# Patient Record
Sex: Male | Born: 1950 | Race: White | Hispanic: No | Smoking: Former smoker
Health system: Southern US, Community
[De-identification: ages and names within clinical notes are randomized; demographics above are authoritative.]

## PROBLEM LIST (undated history)

## (undated) DIAGNOSIS — T7840XA Allergy, unspecified, initial encounter: Secondary | ICD-10-CM

## (undated) DIAGNOSIS — I719 Aortic aneurysm of unspecified site, without rupture: Secondary | ICD-10-CM

## (undated) DIAGNOSIS — R600 Localized edema: Secondary | ICD-10-CM

## (undated) DIAGNOSIS — E785 Hyperlipidemia, unspecified: Secondary | ICD-10-CM

## (undated) DIAGNOSIS — I2510 Atherosclerotic heart disease of native coronary artery without angina pectoris: Secondary | ICD-10-CM

## (undated) DIAGNOSIS — I10 Essential (primary) hypertension: Secondary | ICD-10-CM

## (undated) DIAGNOSIS — K219 Gastro-esophageal reflux disease without esophagitis: Secondary | ICD-10-CM

## (undated) DIAGNOSIS — R12 Heartburn: Secondary | ICD-10-CM

## (undated) DIAGNOSIS — H9319 Tinnitus, unspecified ear: Secondary | ICD-10-CM

## (undated) HISTORY — DX: Atherosclerotic heart disease of native coronary artery without angina pectoris: I25.10

## (undated) HISTORY — DX: Allergy, unspecified, initial encounter: T78.40XA

## (undated) HISTORY — DX: Tinnitus, unspecified ear: H93.19

## (undated) HISTORY — PX: CATARACT EXTRACTION: SUR2

## (undated) HISTORY — DX: Localized edema: R60.0

## (undated) HISTORY — DX: Hyperlipidemia, unspecified: E78.5

## (undated) HISTORY — PX: RHINOPLASTY: SUR1284

## (undated) HISTORY — DX: Heartburn: R12

## (undated) HISTORY — DX: Gastro-esophageal reflux disease without esophagitis: K21.9

---

## 1972-03-20 HISTORY — PX: HAND SURGERY: SHX662

## 2009-03-20 HISTORY — PX: COLONOSCOPY: SHX174

## 2009-12-07 ENCOUNTER — Telehealth (INDEPENDENT_AMBULATORY_CARE_PROVIDER_SITE_OTHER): Payer: Self-pay | Admitting: *Deleted

## 2009-12-07 ENCOUNTER — Ambulatory Visit: Payer: Self-pay | Admitting: Internal Medicine

## 2009-12-07 DIAGNOSIS — R030 Elevated blood-pressure reading, without diagnosis of hypertension: Secondary | ICD-10-CM | POA: Insufficient documentation

## 2009-12-09 ENCOUNTER — Ambulatory Visit: Payer: Self-pay | Admitting: Internal Medicine

## 2009-12-10 LAB — CONVERTED CEMR LAB
ALT: 12 units/L (ref 0–53)
AST: 17 units/L (ref 0–37)
Albumin: 3.8 g/dL (ref 3.5–5.2)
Alkaline Phosphatase: 75 units/L (ref 39–117)
Basophils Relative: 0.6 % (ref 0.0–3.0)
CO2: 26 meq/L (ref 19–32)
Chloride: 102 meq/L (ref 96–112)
Direct LDL: 164.8 mg/dL
Eosinophils Relative: 1.3 % (ref 0.0–5.0)
GFR calc non Af Amer: 92.75 mL/min (ref >60)
Glucose, Bld: 98 mg/dL (ref 70–99)
HCT: 39.6 % (ref 39.0–52.0)
HDL: 36.3 mg/dL — ABNORMAL LOW (ref >39.00)
Lymphs Abs: 1.7 10*3/uL (ref 0.7–4.0)
MCHC: 34.2 g/dL (ref 30.0–36.0)
MCV: 92.2 fL (ref 78.0–100.0)
Monocytes Absolute: 0.7 10*3/uL (ref 0.1–1.0)
Platelets: 272 10*3/uL (ref 150.0–400.0)
Potassium: 4.7 meq/L (ref 3.5–5.1)
Sodium: 138 meq/L (ref 135–145)
WBC: 6.3 10*3/uL (ref 4.5–10.5)

## 2010-01-04 ENCOUNTER — Telehealth: Payer: Self-pay | Admitting: Family Medicine

## 2010-01-04 ENCOUNTER — Ambulatory Visit: Payer: Self-pay | Admitting: Internal Medicine

## 2010-01-04 DIAGNOSIS — K2940 Chronic atrophic gastritis without bleeding: Secondary | ICD-10-CM

## 2010-01-04 DIAGNOSIS — E785 Hyperlipidemia, unspecified: Secondary | ICD-10-CM | POA: Insufficient documentation

## 2010-01-25 ENCOUNTER — Encounter: Payer: Self-pay | Admitting: Family Medicine

## 2010-02-07 ENCOUNTER — Ambulatory Visit: Payer: Self-pay | Admitting: Unknown Physician Specialty

## 2010-02-07 ENCOUNTER — Encounter: Payer: Self-pay | Admitting: Family Medicine

## 2010-02-07 LAB — HM COLONOSCOPY

## 2010-02-09 LAB — PATHOLOGY REPORT

## 2010-02-22 ENCOUNTER — Ambulatory Visit: Payer: Self-pay | Admitting: Internal Medicine

## 2010-03-28 ENCOUNTER — Ambulatory Visit
Admission: RE | Admit: 2010-03-28 | Discharge: 2010-03-28 | Payer: Self-pay | Source: Home / Self Care | Attending: Family Medicine | Admitting: Family Medicine

## 2010-03-28 DIAGNOSIS — K2100 Gastro-esophageal reflux disease with esophagitis, without bleeding: Secondary | ICD-10-CM | POA: Insufficient documentation

## 2010-04-19 NOTE — Progress Notes (Signed)
----   Converted from flag ---- ---- 12/07/2009 12:39 PM, Trevor Boyden  MD wrote: please add CBC when patient comes for blood.  thanks ------------------------------

## 2010-04-19 NOTE — Assessment & Plan Note (Signed)
Summary: NEW PT TO EST/ABD PAIN/CLE   Vital Signs:  Patient profile:   60 year old male Height:      66 inches Weight:      157 pounds BMI:     25.43 Temp:     98.7 degrees F oral Pulse rate:   76 / minute Pulse rhythm:   regular BP sitting:   150 / 80  (left arm) Cuff size:   regular  Vitals Entered By: Selena Batten Dance CMA Duncan Dull) (December 07, 2009 9:32 AM)  CC: New to establish/Abd pain   History of Present Illness: CC: new patient, stomach  1. stomach issues - h/o indigestion, last few months worse.  1 mo ago had bad episode - heartburn with pain, now having pressure pain lower left abdomen.  Pain and indigestion after any solid foods.  + irregular bowel movements.  No blood in stool noted.  No nausea/vomiting.  No pain with defecation.  No fevers/chills.  weight down about 9 lbs in last 2 months.  appetite still there but not eating 2/2 worrying about pain.  2. BP up - never had elevated in past.  never on meds.  no HA, vision changes, chest pain, tightness, urinary changes, LE swelling.    Last tetanus shot years ago. Due for flu shot. Never had colonoscopy Had prostate checked, told normal.  Preventive Screening-Counseling & Management  Caffeine-Diet-Exercise     Caffeine use/day: 3-4 cup/day (coffee, soda, tea)  Current Medications (verified): 1)  Multivitamins  Tabs (Multiple Vitamin) .... 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  The patient denies anorexia, fever, weight gain, vision loss, decreased hearing, hoarseness, chest pain, syncope, dyspnea on exertion, peripheral edema, prolonged cough, headaches, hemoptysis, melena, hematochezia, hematuria, incontinence, muscle weakness, suspicious skin lesions, transient blindness, difficulty walking, depression, testicular masses, and enlarged lymph nodes.         lost 8-9 lbs in last 2-3 wks 2/2 pain per HPI  Physical Exam  General:  Well-developed,well-nourished,in no acute distress; alert,appropriate and cooperative throughout examination Head:  Normocephalic and atraumatic without obvious abnormalities. No apparent alopecia or balding. Eyes:  No corneal or conjunctival inflammation noted. EOMI. Perrla.  Ears:  External ear exam shows no significant lesions or deformities.  Otoscopic examination reveals clear canals, tympanic membranes are intact bilaterally without bulging, retraction, inflammation or discharge. Hearing is grossly normal bilaterally. Nose:  External nasal examination shows no deformity or inflammation. Nasal mucosa are pink and moist without lesions or exudates. Mouth:  Oral mucosa and oropharynx without lesions or exudates.  Teeth in good repair. Neck:  No deformities, masses, or tenderness noted.  no bruits Lungs:  Normal respiratory effort, chest expands symmetrically. Lungs are clear to auscultation, no crackles or wheezes. Heart:  Normal rate and regular rhythm. S1 and S2 normal without gallop, murmur, click, rub or other extra sounds. Abdomen:  Bowel sounds positive, abdomen soft without masses, organomegaly or hernias noted.  + tenderness to palpation epigastrically  as well as medial RUQ.  negative rebound negative murphy.  No abd bruits auscultated. Rectal:  No external abnormalities noted. Normal sphincter tone. No rectal masses or tenderness.  guaiac negative Prostate:  Prostate gland firm and smooth, no enlargement,  nodularity, tenderness, mass, asymmetry or induration.  + slight irregularity (crater).  Msk:  No deformity or scoliosis noted of thoracic or lumbar spine.   Pulses:  2+ periph pulses Extremities:  no edema Neurologic:  CN grossly intact, gait and station intact, sensation and strength intact Skin:  Intact without suspicious lesions or rashes Psych:  full affect   Impression & Recommendations:  Problem # 1:  ABDOMINAL PAIN, LEFT UPPER QUADRANT (ICD-789.02) leading differential is GERD.  treat as such with discussion of GERD precautions and foods to avoid, omeprazole daily for 3 wks, then as needed.  RTC 3 wks, sooner for red flags.  if not improving witih current treatment, consider diverticulitis (although no fever or blood in stool) vs other pathology (rec colonsocopy)  Problem # 2:  HEALTH MAINTENANCE EXAM (ICD-V70.0) Reviewed preventive care protocols, scheduled due services, and updated immunizations.  tdap and flu today.  Problem # 3:  SPECIAL SCREENING MALIGNANT NEOPLASM OF PROSTATE (ICD-V76.44) PSA with blood work.  DRE with slight irregularity prostate, but no nodules, masses, nontender.  await PSA.  Problem # 4:  SPECIAL SCREENING FOR MALIGNANT NEOPLASMS COLON (ICD-V76.51)  hemoccult neg today.  likely recommend get colonoscopy.  Orders: Hemoccult Guaiac-1 spec.(in office) (82270)  Problem # 5:  ELEVATED BP READING WITHOUT DX HYPERTENSION (ICD-796.2) continue to monitor.  may need meds to help control, but first time meeting new doc.  BP today: 150/80  Complete Medication List: 1)  Multivitamins Tabs (Multiple vitamin) .... One a day 2)  Omeprazole 40 Mg Cpdr (Omeprazole) .... One daily for 3 weeks then as needed reflux  Other Orders: Flu Vaccine 74yrs + (09811) Admin 1st Vaccine (91478) Tdap => 45yrs IM (29562) Admin of Any Addtl Vaccine (13086)  Patient Instructions: 1)  Please return this week in AM fasting for blood work 2)  [FLP, CMP, PSA, TSH, lipase  789.02, V76.49, V70.0] 3)  Please return in 3-4 wks for follow up. 4)  tetanus and flu shot today. 5)  your stomach pain could be beginnings of reflux. 6)  Start omeprazole daily for 3wks then as needed reflux symptoms. 7)  Head of bed elevated. 8)  Avoidance of citrus, fatty foods, chocolate, peppermint, and excessive alcohol, along with sodas, orange juice (acidic drinks) 9)  At least a few hours between dinner and bed, minimize naps after eating. 10)  Please return sooner if contineu to lose weight despite medicine, or worsening of pain or fevers >101.5 or nausea/vomiting or other concerns. 11)  Pleasure to meet you today, call clinic with questions. Prescriptions: OMEPRAZOLE 40 MG CPDR (OMEPRAZOLE) one daily for 3 weeks then as needed reflux  #30 x 3   Entered and Authorized by:   Eustaquio Boyden  MD   Signed by:   Eustaquio Boyden  MD on 12/07/2009   Method used:   Electronically to        [facility]  [address] (retail)       [address]       [place], [place]  [address]       Ph: [phone]       Fax: [phone]   RxID:   [id number]   Prior Medications: Current Allergies (  reviewed today): No known allergies    Immunizations Administered:  Influenza Vaccine # 1:    Vaccine Type: Fluvax 3+    Site: right deltoid    Mfr: GlaxoSmithKline    Dose: 0.5 ml    Route: IM    Given by: Janee Morn CMA (AAMA)    Exp. Date: 09/17/2010    Lot #: [id number]    VIS given: 10/12/09 version given December 07, 2009.  Tetanus Vaccine:    Vaccine Type: Tdap    Site: left deltoid    Mfr: GlaxoSmithKline    Dose: 0.5 ml    Route: IM    Given by: Selena Batten Dance CMA (AAMA)    Exp. Date: 12/09/2011    Lot #: [id number]    VIS given: 02/05/08 version given December 07, 2009.  Flu Vaccine Consent Questions:    Do you have a history of severe allergic reactions to this vaccine? no    Any prior history of allergic reactions to egg and/or gelatin? no    Do you  have a sensitivity to the preservative Thimersol? no    Do you have a past history of Guillan-Barre Syndrome? no    Do you currently have an acute febrile illness? no    Have you ever had a severe reaction to latex? no    Vaccine information given and explained to patient? yes    Prevention & Chronic Care Immunizations   Influenza vaccine: Fluvax 3+  (12/07/2009)   Influenza vaccine due: 11/19/2010    Tetanus booster: 12/07/2009: Tdap   Tetanus booster due: 12/08/2019    Pneumococcal vaccine: Not documented  Colorectal Screening   Hemoccult: Not documented    Colonoscopy: Not documented  Other Screening   PSA: Not documented   Smoking status: Not documented  Lipids   Total Cholesterol: Not documented   LDL: Not documented   LDL Direct: Not documented   HDL: Not documented   Triglycerides: Not documented

## 2010-04-19 NOTE — Progress Notes (Signed)
Summary: fyi regarding GI referral...  Phone Note Call from Patient   Summary of Call: GI referral is not scheduled until Nov 22nd, however he was placed on a waiting list..Marland KitchenPt says his ins will pay at a higher rate if he goes to [facility]. fyi to Dr. Sharen Hones.Daine Gip  January 04, 2010 9:04 AM  Initial call taken by: Daine Gip,  January 04, 2010 9:04 AM  Follow-up for Phone Call        noted Follow-up by: Eustaquio Boyden  MD,  January 04, 2010 11:25 PM

## 2010-04-19 NOTE — Procedures (Signed)
Summary: Upper GI Endoscopy by Dr.[facility]  Upper GI Endoscopy by Dr.Robert [facility]   Imported By: Beau Fanny 02/15/2010 15:53:46  _____________________________________________________________________  External Attachment:    Type:   Image     Comment:   External Document

## 2010-04-19 NOTE — Assessment & Plan Note (Signed)
Summary: 3-4 week follow up/rbh   Vital Signs:  Patient profile:   60 year old male Weight:      156 pounds Temp:     98.4 degrees F oral Pulse rate:   80 / minute Pulse rhythm:   regular BP sitting:   130 / 78  (left arm) Cuff size:   regular  Vitals Entered By: Selena Batten Dance CMA Duncan Dull) (January 04, 2010 8:04 AM) CC: Follow up   History of Present Illness: CC: f/u abd pain  abd pain - has area on left upper abdomen where feels irritation/inflammation pain.  Having irregular bowel movements.  After eating solids feels bloating and pain come on.  Had lost 10 lbs because worried with eating 2/2 pain.  No problem with drinking liquids.  Prescribed omeprazole 40mg  last visit, took for 3 wks daily and pain improved, but never really went away, still had to space out meals and eat small quantities.  Now taking PPI intermittently.  + early satiety.  Pain described as bloating pressure irritation, worse after eating.  No stabbing, no radiation.  No fevers/chills, no blood in stool, voiding ok.  Laying off orance juice.    Requests to go to [facility].  Quit smoking 30 years ago.  no fmhx of colon cancer, stomach cancer.  has cut back on all EtOH given stomach pain.  Current Medications (verified): 1)  Multivitamins  Tabs (Multiple Vitamin) .... One A Day 2)  Omeprazole 40 Mg Cpdr (Omeprazole) .... One Daily For 3 Weeks Then As Needed Reflux  Allergies (verified): No Known Drug Allergies  Past History:  Past medical, surgical, family and social histories (including risk factors) reviewed for relevance to current acute and chronic problems.  Past Medical History: Reviewed history from 12/07/2009 and no changes required. none  Past Surgical History: Reviewed history from 12/07/2009 and no changes required. cataract surgery both eyes  right hand surgery 1974 nasal septal deviation surgery 1973  Family History: Reviewed history from 12/07/2009 and no changes required. F: D CVA (61yo) M:  D CAD/MI (61yo)  No DM, CA  Social History: Reviewed history from 12/07/2009 and no changes required. 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 Lungs are clear to auscultation, no crackles or wheezes. Heart:  Normal rate and regular rhythm. S1 and S2 normal without gallop, murmur, click, rub or other extra sounds. Abdomen:  Bowel sounds positive, abdomen soft without masses, organomegaly or hernias noted.  + tenderness to palpation epigastrically.  negative rebound, negative murphy.  No abd bruits auscultated. Pulses:  2+ periph pulses Extremities:  no edema   Impression & Recommendations:  Problem # 1:  ABDOMINAL PAIN, EPIGASTRIC (ICD-789.06) ?ulcer vs stricture.  referral to GI given continued problem despite PPI, weight loss, early satiety.  h/o GERD, due for colonoscopy as well.  No family h/o stomach or colon cancer.  blood work last visit WNL (provided copy to patient to bring to appt with GI).  f/u with me in 3-4 mo after sees GI, return sooner if any concerns.  Orders: Gastroenterology Referral (GI)  Problem # 2:  HYPERLIPIDEMIA (ICD-272.4) LDL too high, pt would like medication given fm hx.  start pravastatin, advised may wait until after sees GI to start cholesterol  meds.  His updated medication list for this problem includes:    Pravastatin Sodium 40 Mg Tabs (Pravastatin sodium) .Marland Kitchen... Take one daily for cholesterol at night  Labs Reviewed: SGOT: 17 (12/09/2009)   SGPT: 12 (12/09/2009)   HDL:36.30 (12/09/2009)  Chol:212 (12/09/2009)  Trig:124.0 (12/09/2009)  Problem # 3:  ELEVATED BP READING WITHOUT DX HYPERTENSION (ICD-796.2) bp good today.  continue to  monitor.  BP today: 130/78 Prior BP: 150/80 (12/07/2009)  Labs Reviewed: Creat: 0.9 (12/09/2009) Chol: 212 (12/09/2009)   HDL: 36.30 (12/09/2009)   TG: 124.0 (12/09/2009)  Instructed in low sodium diet (DASH Handout) and behavior modification.    Complete Medication List: 1)  Multivitamins Tabs (Multiple vitamin) .... One a day 2)  Omeprazole 40 Mg Cpdr (Omeprazole) .... One daily for 3 weeks then as needed reflux 3)  Pravastatin Sodium 40 Mg Tabs (Pravastatin sodium) .... Take one daily for cholesterol at night  Patient Instructions: 1)  Return in 3-4 mo for follow up. 2)  Start pravastatin for cholesterol, this may wait until after you see stomach doctor. 3)  Referral to stomach doctor for abdominal pain.  May be ulcer but I think it's prudent to evaluate your stomach better with endoscopy.  He may also do colonoscopy since you're due.  Stop by Marion's office to set up appointment.   4)  Good to see you today, call clinic with quesitons. Prescriptions: PRAVASTATIN SODIUM 40 MG TABS (PRAVASTATIN SODIUM) take one daily for cholesterol at night  #30 x 3   Entered and Authorized by:   Eustaquio Boyden  MD   Signed by:   Eustaquio Boyden  MD on 01/04/2010   Method used:   Electronically to        [facility]  [address] (retail)       [address]       [place], [place]  [address]       Ph: [phone]       Fax: [phone]   RxID:   [id number]    Orders Added: 1)  Gastroenterology Referral [GI] 2)  Est. Patient Level III [84132]    Current Allergies (reviewed today): No known allergies

## 2010-04-19 NOTE — Procedures (Signed)
Summary: Colonoscopy by Dr.Robert [facility]  Colonoscopy by Dr.Robert [facility]   Imported By: Beau Fanny 02/15/2010 15:53:14  _____________________________________________________________________  External Attachment:    Type:   Image     Comment:   External Document  Appended Document: Colonoscopy by Dr.Robert [facility]    Clinical Lists Changes  Observations: Added new observation of COLONNXTDUE: 02/2020 (02/15/2010 16:09) Added new observation of PAST SURG HX: cataract surgery both eyes  right hand surgery 1974 nasal septal deviation surgery 1973 colonoscopy - diverticulosis, int hemmorrhoids, rpt 10 years (01/2010) EGD - Barrett's esophagus, erosive gastropathy, awaiting bx path (01/2010) (02/15/2010 16:09) Added new observation of COLONOSCOPY:  Results: Hemorrhoids.     Results: Diverticulosis.        (02/07/2010 16:12)        Colonoscopy  Procedure date:  02/07/2010  Findings:       Results: Hemorrhoids.     Results: Diverticulosis.         Procedures Next Due Date:    Colonoscopy: 02/2020   Past History:  Past Surgical History: cataract surgery both eyes  right hand surgery 1974 nasal septal deviation surgery 1973 colonoscopy - diverticulosis, int hemmorrhoids, rpt 10 years (01/2010) EGD - Barrett's esophagus, erosive gastropathy, awaiting bx path (01/2010)

## 2010-04-19 NOTE — Consult Note (Signed)
Summary: Anola Gurney Dr. Markham Jordan - set up with UGI,colon, stool O&P  [facility]   Imported By: Maryln Gottron 02/14/2010 11:26:37  _____________________________________________________________________  External Attachment:    Type:   Image     Comment:   External Document

## 2010-04-19 NOTE — Assessment & Plan Note (Signed)
Summary: SORE THROAT/ 12:00   Vital Signs:  Patient profile:   60 year old male Weight:      155.25 pounds Temp:     99.1 degrees F oral Pulse rate:   88 / minute Pulse rhythm:   regular BP sitting:   138 / 90  (left arm) Cuff size:   regular  Vitals Entered By: Selena Batten Dance CMA Duncan Dull) (February 22, 2010 12:04 PM) CC: Sore Throat/body aches   History of Present Illness: CC: ST, body aches  4 d h/o ST, congestion, RN and nasal discharge mostly clear some yellow/thick, mild subjective fever.  + sinus tender bilateral.  Taking tylenol and sinus decongestant.    Not more hoarse than usual.  No abd pain, n/v/d, rashes, myalgias, arthralgias.  h/o sinus infections in past.  No smoking for long time.   omeprazole helping stomach.  s/p EGD/colonoscopy  Current Medications (verified): 1)  Multivitamins  Tabs (Multiple Vitamin) .... One A Day 2)  Omeprazole 40 Mg Cpdr (Omeprazole) .... One Daily For 3 Weeks Then As Needed Reflux 3)  Pravastatin Sodium 40 Mg Tabs (Pravastatin Sodium) .... 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 sinus maxillary pressure, nontender Eyes:  No corneal or conjunctival  inflammation noted. EOMI. Perrla.  Ears:  TMs clear bilaterally Nose:  nares congested Mouth:  Oral mucosa and oropharynx without lesions or exudates.  Teeth in good repair. Neck:  No deformities, masses, or tenderness noted.  no bruits Lungs:  Normal respiratory effort, chest expands symmetrically. Lungs are clear to auscultation, no crackles or wheezes. Heart:  Normal rate and regular rhythm. S1 and S2 normal without gallop, murmur, click, rub or other extra sounds. Pulses:  2+ radpulses Extremities:  no pedal edema Skin:  Intact without suspicious lesions or rashes   Impression & Recommendations:  Problem # 1:  SINUSITIS, ACUTE (ICD-461.9)  early likely viral with pharyngitis.  Instructed on treatment. Call if symptoms persist or worsen.  call at end of week if not better, consider abx/nasal steroid  Complete Medication List: 1)  Multivitamins Tabs (Multiple vitamin) .... One a day 2)  Omeprazole 40 Mg Cpdr (Omeprazole) .... One daily for 3 weeks then as needed reflux 3)  Pravastatin Sodium 40 Mg Tabs (Pravastatin sodium) .... Take one daily for cholesterol at night  Patient Instructions: 1)  You have viral respiratory infection. 2)  Take guaifenesin 400mg  IR 1 1/2 pills in am and at noon with plenty of fluid to help mobilize mucous. 3)  Use nasal saline spray or neti pot to help drainage of sinuses. 4)  Continue ibuprofen/tylenol for inflammation in throat. 5)  Push fluids and plenty of rest over next few days. 6)  If not better with above measures, call me with update. 7)  If you start having fevers >101.5, trouble swallowing or  breathing, or are worsening instead of improving as expected, you may need to be seen again. 8)  Good to see you today, call clinic with questions.    Orders Added: 1)  Est. Patient Level III [03474]    Current Allergies (reviewed today): No known allergies   Laboratory Results  Date/Time Received: February 22, 2010 12:06 PM  Date/Time  Reported: February 22, 2010 12:06 PM   Other Tests  Rapid Strep: negative

## 2010-04-21 NOTE — Assessment & Plan Note (Signed)
Summary: 3-4 MONTH FOLLOW UP/[facility]   Vital Signs:  Patient profile:   60 year old male Weight:      158.25 pounds Temp:     98.8 degrees F oral Pulse rate:   76 / minute Pulse rhythm:   regular BP sitting:   140 / 80  (left arm) Cuff size:   regular  Vitals Entered By: Selena Batten Dance CMA Duncan Dull) (March 28, 2010 8:19 AM) CC: 3 month Follow up   History of Present Illness: CC: 11mo f/u  GERD with gastritis and esophagitis - omeprazole 40mg  daily, still reflux sxs about once a week, regardless of what he eats.  tolerating omeprazole daily well.  HLD - pravastatin 40mg  daily.  fmhx CAD/MI.  LDL goal would be <100.  no myalgias.    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 Lungs are clear to auscultation, no crackles or wheezes. Heart:  Normal rate and regular rhythm. S1 and S2 normal without gallop, murmur, click, rub or other extra sounds. Abdomen:  Bowel sounds positive,abdomen soft and non-tender without masses, organomegaly or hernias noted.   Impression &  Recommendations:  Problem # 1:  GASTRITIS, CHRONIC (ICD-535.10)  His updated medication list for this problem includes:    Omeprazole 40 Mg Cpdr (Omeprazole) ..... One daily for 3 weeks then as needed reflux    Nexium 40 Mg Cpdr (Esomeprazole magnesium) .Marland Kitchen... Take one daily for reflux  Discussed use of medication, as well as lifestyle changes.   discussed longterm PPI use and need for good calcium intake, pt states does get good amt dairy products.  Problem # 2:  REFLUX ESOPHAGITIS (ICD-530.11) see above.  not fully controlled on once daily omeprazole.  increase to nexium, have advised pt call insurance ot see if covered, and contact us with alternative if not.  Problem # 3:  HYPERLIPIDEMIA (ICD-272.4) continue.  recheck in 2 mo for 75mo f/u.  goal <100 given fmhx.  if not optimal, likely change to atorvastatin (higher potency) His updated medication list for this problem includes:    Pravastatin Sodium 40 Mg Tabs (Pravastatin sodium) .Marland Kitchen... Take one daily for cholesterol at night  Labs Reviewed: SGOT: 17 (12/09/2009)   SGPT: 12 (12/09/2009)   HDL:36.30 (12/09/2009)  Chol:212 (12/09/2009)  Trig:124.0 (12/09/2009)  Complete Medication List: 1)  Multivitamins Tabs (Multiple vitamin) .... One a day 2)  Omeprazole 40 Mg Cpdr (Omeprazole) .... One daily for 3 weeks then as needed reflux 3)  Pravastatin Sodium 40 Mg Tabs (Pravastatin sodium) .... Take one daily for cholesterol at night 4)  Nexium 40 Mg Cpdr (Esomeprazole magnesium) .... Take one daily for reflux  Patient Instructions: 1)  Check with insurance to see what they prefer as second line  treatment for gastritis/esophagitis as omeprazole isn't fully controlling symptoms. 2)  I have refilled omeprazole x 1 month, and have given you a prescription for nexium.  Call us with update on insurance preference. 3)  Please return in April for blood work [CMP, FLP 272.4] 4)  Please return in 4-6 months for follow up. Prescriptions: NEXIUM 40  MG CPDR (ESOMEPRAZOLE MAGNESIUM) take one daily for reflux  #31 x 1   Entered and Authorized by:   Eustaquio Boyden  MD   Signed by:   Eustaquio Boyden  MD on 03/28/2010   Method used:   Print then Give to Patient   RxID:   [id number] OMEPRAZOLE 40 MG CPDR (OMEPRAZOLE) one daily for 3 weeks then as needed reflux  #30 x 2   Entered and Authorized by:   Eustaquio Boyden  MD   Signed by:   Eustaquio Boyden  MD on 03/28/2010   Method used:   Electronically to        [facility]  [address] (retail)       [address]       [place], [place]  [address]       Ph: [phone]       Fax: [phone]   RxID:   [id number]    Orders Added: 1)  Est. Patient Level III [74259]    Current Allergies (reviewed today): No known allergies   Prevention & Chronic Care Immunizations   Influenza vaccine: Fluvax 3+  (12/07/2009)   Influenza vaccine due: 11/19/2010    Tetanus booster: 12/07/2009: Tdap   Tetanus booster due: 12/08/2019    Pneumococcal vaccine: Not documented    H. zoster vaccine: Not documented  Colorectal Screening   Hemoccult: Not documented   Hemoccult action/deferral: Not indicated  (02/25/2010)    Colonoscopy:  Results: Hemorrhoids.     Results: Diverticulosis.         (02/07/2010)   Colonoscopy due: 02/2020  Other Screening   PSA: 1.93  (12/09/2009)   Smoking status: Not documented  Lipids   Total Cholesterol: 212  (12/09/2009)   LDL: Not documented   LDL Direct: 164.8  (12/09/2009)   HDL: 36.30  (12/09/2009)   Triglycerides: 124.0  (12/09/2009)    SGOT (AST): 17  (12/09/2009)   SGPT (ALT): 12  (12/09/2009)   Alkaline phosphatase: 75  (12/09/2009)   Total bilirubin: 0.7  (12/09/2009)    Lipid flowsheet reviewed?: Yes   Progress toward LDL goal: Unchanged  Self-Management Support :   Personal Goals (by the next clinic visit) :      Personal LDL goal: 130  (03/28/2010)    Lipid self-management support: Not  documented

## 2010-06-16 ENCOUNTER — Other Ambulatory Visit: Payer: Self-pay | Admitting: Family Medicine

## 2010-06-16 DIAGNOSIS — E785 Hyperlipidemia, unspecified: Secondary | ICD-10-CM

## 2010-06-16 NOTE — Telephone Encounter (Signed)
Ok to fill.  Sent in 1 month supply.

## 2010-06-23 ENCOUNTER — Other Ambulatory Visit: Payer: Self-pay | Admitting: Family Medicine

## 2010-06-23 DIAGNOSIS — E785 Hyperlipidemia, unspecified: Secondary | ICD-10-CM

## 2010-06-28 ENCOUNTER — Other Ambulatory Visit (INDEPENDENT_AMBULATORY_CARE_PROVIDER_SITE_OTHER): Payer: PRIVATE HEALTH INSURANCE | Admitting: Family Medicine

## 2010-06-28 DIAGNOSIS — E785 Hyperlipidemia, unspecified: Secondary | ICD-10-CM

## 2010-06-28 LAB — COMPREHENSIVE METABOLIC PANEL
BUN: 13 mg/dL (ref 6–23)
CO2: 28 mEq/L (ref 19–32)
Calcium: 9.4 mg/dL (ref 8.4–10.5)
Chloride: 105 mEq/L (ref 96–112)
Creatinine, Ser: 0.9 mg/dL (ref 0.4–1.5)
GFR: 90.23 mL/min (ref >60.00)

## 2010-06-28 LAB — LIPID PANEL
Cholesterol: 160 mg/dL (ref 0–200)
HDL: 38.9 mg/dL — ABNORMAL LOW (ref >39.00)
Triglycerides: 183 mg/dL — ABNORMAL HIGH (ref 0.0–149.0)

## 2010-09-27 ENCOUNTER — Ambulatory Visit: Payer: Self-pay | Admitting: Family Medicine

## 2010-10-06 ENCOUNTER — Ambulatory Visit: Payer: Self-pay | Admitting: Family Medicine

## 2012-07-02 LAB — LIPID PANEL
CHOLESTEROL: 260 mg/dL — AB (ref 0–200)
HDL: 55 mg/dL (ref 35–70)
LDL Cholesterol: 169 mg/dL
TRIGLYCERIDES: 180 mg/dL — AB (ref 40–160)

## 2012-07-02 LAB — PSA: PSA: 3.1

## 2012-07-02 LAB — BASIC METABOLIC PANEL
BUN: 17 mg/dL (ref 4–21)
Creatinine: 1.3 mg/dL (ref 0.6–1.3)

## 2013-05-25 ENCOUNTER — Ambulatory Visit: Payer: Self-pay | Admitting: Physician Assistant

## 2013-05-25 LAB — RAPID STREP-A WITH REFLX: Micro Text Report: NEGATIVE

## 2013-05-27 LAB — BETA STREP CULTURE(ARMC)

## 2014-07-28 DIAGNOSIS — I714 Abdominal aortic aneurysm, without rupture, unspecified: Secondary | ICD-10-CM | POA: Insufficient documentation

## 2014-07-28 DIAGNOSIS — E782 Mixed hyperlipidemia: Secondary | ICD-10-CM | POA: Insufficient documentation

## 2014-07-28 DIAGNOSIS — I10 Essential (primary) hypertension: Secondary | ICD-10-CM | POA: Insufficient documentation

## 2014-08-13 ENCOUNTER — Other Ambulatory Visit: Payer: Self-pay | Admitting: Vascular Surgery

## 2014-08-13 DIAGNOSIS — I714 Abdominal aortic aneurysm, without rupture, unspecified: Secondary | ICD-10-CM

## 2014-08-13 DIAGNOSIS — I712 Thoracic aortic aneurysm, without rupture, unspecified: Secondary | ICD-10-CM

## 2014-08-21 ENCOUNTER — Ambulatory Visit
Admission: RE | Admit: 2014-08-21 | Discharge: 2014-08-21 | Disposition: A | Discharge status: Home / Self Care | Payer: No Typology Code available for payment source | Source: Ambulatory Visit | Attending: Vascular Surgery | Admitting: Vascular Surgery

## 2014-08-21 ENCOUNTER — Ambulatory Visit: Admission: RE | Admit: 2014-08-21 | Payer: No Typology Code available for payment source | Source: Ambulatory Visit

## 2014-08-21 DIAGNOSIS — I714 Abdominal aortic aneurysm, without rupture, unspecified: Secondary | ICD-10-CM

## 2014-08-21 DIAGNOSIS — N400 Enlarged prostate without lower urinary tract symptoms: Secondary | ICD-10-CM | POA: Diagnosis not present

## 2014-08-21 DIAGNOSIS — I712 Thoracic aortic aneurysm, without rupture, unspecified: Secondary | ICD-10-CM

## 2014-08-21 DIAGNOSIS — K8020 Calculus of gallbladder without cholecystitis without obstruction: Secondary | ICD-10-CM | POA: Insufficient documentation

## 2014-08-21 DIAGNOSIS — R918 Other nonspecific abnormal finding of lung field: Secondary | ICD-10-CM | POA: Diagnosis not present

## 2014-08-21 DIAGNOSIS — R911 Solitary pulmonary nodule: Secondary | ICD-10-CM | POA: Diagnosis not present

## 2014-08-21 DIAGNOSIS — I2510 Atherosclerotic heart disease of native coronary artery without angina pectoris: Secondary | ICD-10-CM | POA: Insufficient documentation

## 2014-08-21 HISTORY — DX: Aortic aneurysm of unspecified site, without rupture: I71.9

## 2014-08-21 HISTORY — DX: Essential (primary) hypertension: I10

## 2014-08-21 MED ORDER — IOHEXOL 350 MG/ML SOLN
100.0000 mL | Freq: Once | INTRAVENOUS | Status: AC | PRN
  Administered 2014-08-21: 150 mL via INTRAVENOUS

## 2015-02-02 DIAGNOSIS — R600 Localized edema: Secondary | ICD-10-CM | POA: Insufficient documentation

## 2015-03-03 DIAGNOSIS — I10 Essential (primary) hypertension: Secondary | ICD-10-CM | POA: Diagnosis not present

## 2015-03-29 ENCOUNTER — Encounter: Payer: Self-pay | Admitting: Internal Medicine

## 2015-03-29 ENCOUNTER — Other Ambulatory Visit: Payer: Self-pay | Admitting: Internal Medicine

## 2015-03-29 DIAGNOSIS — K2100 Gastro-esophageal reflux disease with esophagitis, without bleeding: Secondary | ICD-10-CM

## 2015-03-29 DIAGNOSIS — K2940 Chronic atrophic gastritis without bleeding: Secondary | ICD-10-CM

## 2015-04-08 ENCOUNTER — Encounter: Payer: Self-pay | Admitting: Internal Medicine

## 2015-05-17 ENCOUNTER — Ambulatory Visit (INDEPENDENT_AMBULATORY_CARE_PROVIDER_SITE_OTHER): Payer: Commercial Managed Care - HMO | Admitting: Internal Medicine

## 2015-05-17 ENCOUNTER — Encounter: Payer: Self-pay | Admitting: Internal Medicine

## 2015-05-17 VITALS — BP 176/98 | HR 84 | Ht 65.5 in | Wt 153.6 lb

## 2015-05-17 DIAGNOSIS — I714 Abdominal aortic aneurysm, without rupture, unspecified: Secondary | ICD-10-CM

## 2015-05-17 DIAGNOSIS — R911 Solitary pulmonary nodule: Secondary | ICD-10-CM | POA: Insufficient documentation

## 2015-05-17 DIAGNOSIS — Z0000 Encounter for general adult medical examination without abnormal findings: Secondary | ICD-10-CM

## 2015-05-17 DIAGNOSIS — Z125 Encounter for screening for malignant neoplasm of prostate: Secondary | ICD-10-CM | POA: Diagnosis not present

## 2015-05-17 DIAGNOSIS — Z23 Encounter for immunization: Secondary | ICD-10-CM | POA: Diagnosis not present

## 2015-05-17 DIAGNOSIS — I10 Essential (primary) hypertension: Secondary | ICD-10-CM

## 2015-05-17 DIAGNOSIS — E785 Hyperlipidemia, unspecified: Secondary | ICD-10-CM | POA: Diagnosis not present

## 2015-05-17 MED ORDER — HYDROCHLOROTHIAZIDE 25 MG PO TABS: 25.0000 mg | ORAL_TABLET | Freq: Every day | ORAL | Status: DC

## 2015-05-17 NOTE — Patient Instructions (Addendum)
Health Maintenance  Topic Date Due  . Hepatitis C Screening  11/14/2015 (Originally May 22, 1950)  . ZOSTAVAX  05/16/2020 (Originally 03/04/2010)  . HIV Screening  05/16/2020 (Originally 03/04/1966)  . INFLUENZA VACCINE  10/19/2015  . PNA vac Low Risk Adult (2 of 2 - PPSV23) 05/16/2016  . COLONOSCOPY  08/19/2019  . TETANUS/TDAP  12/08/2019      Pneumococcal Conjugate Vaccine (PCV13)  1. Why get vaccinated? Vaccination can protect both children and adults from pneumococcal disease. Pneumococcal disease is caused by bacteria that can spread from person to person through close contact. It can cause ear infections, and it can also lead to more serious infections of the:  Lungs (pneumonia),  Blood (bacteremia), and  Covering of the brain and spinal cord (meningitis). Pneumococcal pneumonia is most common among adults. Pneumococcal meningitis can cause deafness and brain damage, and it kills about 1 child in 10 who get it. Anyone can get pneumococcal disease, but children under 23 years of age and adults 51 years and older, people with certain medical conditions, and cigarette smokers are at the highest risk. Before there was a vaccine, the [place] saw:  more than 700 cases of meningitis,  about 13,000 blood infections,  about 5 million ear infections, and  about 200 deaths in children under 5 each year from pneumococcal disease. Since vaccine became available, severe pneumococcal disease in these children has fallen by 88%. About 18,000 older adults die of pneumococcal disease each year in the [place]. Treatment of pneumococcal infections with penicillin and other drugs is not as effective as it used to be, because some strains of the disease have become resistant to these drugs. This makes prevention of the disease, through vaccination, even more important. 2. PCV13 vaccine Pneumococcal conjugate vaccine (called PCV13) protects against 13 types of pneumococcal  bacteria. PCV13 is routinely given to children at 2, 4, 6, and 21-64 months of age. It is also recommended for children and adults 24 to 6 years of age with certain health conditions, and for all adults 49 years of age and older. Your doctor can give you details. 3. Some people should not get this vaccine Anyone who has ever had a life-threatening allergic reaction to a dose of this vaccine, to an earlier pneumococcal vaccine called PCV7, or to any vaccine containing diphtheria toxoid (for example, DTaP), should not get PCV13. Anyone with a severe allergy to any component of PCV13 should not get the vaccine. Tell your doctor if the person being vaccinated has any severe allergies. If the person scheduled for vaccination is not feeling well, your healthcare provider might decide to reschedule the shot on another day. 4. Risks of a vaccine reaction With any medicine, including vaccines, there is a chance of reactions. These are usually mild and go away on their own, but serious reactions are also possible. Problems reported following PCV13 varied by age and dose in the series. The most common problems reported among children were:  About half became drowsy after the shot, had a temporary loss of appetite, or had redness or tenderness where the shot was given.  About 1 out of 3 had swelling where the shot was given.  About 1 out of 3 had a mild fever, and about 1 in 20 had a fever over 102.37F.  Up to about 8 out of 10 became fussy or irritable. Adults have reported pain, redness, and swelling where the shot was given; also mild fever, fatigue, headache, chills, or muscle pain. Young  children who get PCV13 along with inactivated flu vaccine at the same time may be at increased risk for seizures caused by fever. Ask your doctor for more information. Problems that could happen after any vaccine:  People sometimes faint after a medical procedure, including vaccination. Sitting or lying down for about  15 minutes can help prevent fainting, and injuries caused by a fall. Tell your doctor if you feel dizzy, or have vision changes or ringing in the ears.  Some older children and adults get severe pain in the shoulder and have difficulty moving the arm where a shot was given. This happens very rarely.  Any medication can cause a severe allergic reaction. Such reactions from a vaccine are very rare, estimated at about 1 in a million doses, and would happen within a few minutes to a few hours after the vaccination. As with any medicine, there is a very small chance of a vaccine causing a serious injury or death. The safety of vaccines is always being monitored. For more information, visit: http://floyd.org/ 5. What if there is a serious reaction? What should I look for?  Look for anything that concerns you, such as signs of a severe allergic reaction, very high fever, or unusual behavior. Signs of a severe allergic reaction can include hives, swelling of the face and throat, difficulty breathing, a fast heartbeat, dizziness, and weakness-usually within a few minutes to a few hours after the vaccination. What should I do?  If you think it is a severe allergic reaction or other emergency that can't wait, call 9-1-1 or get the person to the nearest hospital. Otherwise, call your doctor. Reactions should be reported to the Vaccine Adverse Event Reporting System (VAERS). Your doctor should file this report, or you can do it yourself through the VAERS web site at www.vaers.LAgents.no, or by calling 1-859-629-1284. VAERS does not give medical advice. 6. The National Vaccine Injury Compensation Program The [employer] Vaccine Injury Compensation Program (VICP) is a federal program that was created to compensate people who may have been injured by certain vaccines. Persons who believe they may have been injured by a vaccine can learn about the program and about filing a claim by calling 1-574-232-9637 or  visiting the VICP website at SpiritualWord.at. There is a time limit to file a claim for compensation. 7. How can I learn more?  Ask your healthcare provider. He or she can give you the vaccine package insert or suggest other sources of information.  Call your local or state health department.  Contact the Centers for Disease Control and Prevention (CDC):  Call (726) 355-8951 (1-800-CDC-INFO) or  Visit CDC's website at PicCapture.uy Vaccine Information Statement PCV13 Vaccine (01/22/2014)   This information is not intended to replace advice given to you by your health care provider. Make sure you discuss any questions you have with your health care provider.   Document Released: 01/01/2006 Document Revised: 03/27/2014 Document Reviewed: 01/29/2014 Elsevier Interactive Patient Education [employer]

## 2015-05-17 NOTE — Progress Notes (Signed)
Patient: Trevor Werner, Male    DOB: April 19, 1950, 65 y.o.   MRN: [id number] Visit Date: 05/17/2015  Today's Provider: Bari Edward, MD   Chief Complaint  Patient presents with  . Annual Exam  . Hypertension  . Hyperlipidemia   Subjective:   Initial preventative physical exam Trevor Werner is a 65 y.o. male who presents today for his Initial Preventative Physical Exam. He feels fairly well. He reports exercising walking regularly and doing yard work. He reports he is sleeping well.  Hypertension This is a chronic problem. The current episode started more than 1 year ago. The problem has been waxing and waning since onset. The problem is uncontrolled (average 140/80). Associated symptoms include chest pain. Pertinent negatives include no headaches, palpitations or shortness of breath. Risk factors for coronary artery disease include dyslipidemia.  Hyperlipidemia This is a chronic problem. The problem is controlled. Recent lipid tests were reviewed and are normal. Associated symptoms include chest pain. Pertinent negatives include no myalgias or shortness of breath. Current antihyperlipidemic treatment includes statins. The current treatment provides significant improvement of lipids.  AAA - patient is being followed by Vascular surgery.  He is a former smoker.  He denies abdominal pain or back pain. CT Angio dated 08/2014: IMPRESSION: VASCULAR  1. 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 The maximal aortic diameter is 5.6 cm (best measured on coronal reformatted images) in the mid descending thoracic aorta. The aortic root, ascending segments, arch and abdominal aorta remain within normal limits although the abdominal aorta is mildly ectatic. 2. A linear filling defect extending from eccentric thrombus at the aortic wall into the aortic lumen is concerning for a region of irregular and pedunculated thrombus. This could pose a risk for distal embolization. 3. Ectatic  bilateral common iliac arteries. 4. Replaced right hepatic artery and multiple bilateral renal arteries noted incidentally. NON VASCULAR  1. Nonspecific 6 mm ground-glass attenuation nodular opacity in the right upper lung. Differential considerations include a focus of infection/inflammation and potentially a low grade adenocarcinoma or adenocarcinoma in situ. Initial follow-up by chest CT without contrast is recommended in 3 months to confirm persistence. This recommendation follows the consensus statement: Recommendations for the Management of Subsolid Pulmonary Nodules Detected at CT: A Statement from the Fleischner Society as published in Radiology 2013; 266:304-317. 2. A subpleural 6 mm pulmonary nodule in the periphery of the lingula is highly likely a subpleural lymph node. Recommend attention on routine followup imaging for thoracic aorta surveillance. 3. Multivessel coronary artery calcifications. Please note that although the presence of coronary artery calcium documents the presence of coronary artery disease, the severity of this disease and any potential stenosis cannot be assessed on this non-gated CT examination. Assessment for potential risk factor modification, dietary therapy or pharmacologic therapy may be warranted, if clinically indicated. 4. Cholelithiasis. 5. Prostatomegaly.  These results will be called to the ordering clinician or representative by the Radiologist Assistant, and communication documented in the PACS or zVision Dashboard.  Signed,  Sterling Big, MD  [facility]  Review of Systems  Constitutional: Negative for fever, chills, diaphoresis and fatigue.  HENT: Negative for hearing loss, tinnitus, trouble swallowing and voice change.   Eyes: Negative for visual disturbance.  Respiratory: Negative for cough, chest tightness, shortness of breath and wheezing.   Cardiovascular: Positive for  chest pain. Negative for palpitations and leg swelling.  Gastrointestinal: Negative for diarrhea, constipation and blood in stool.  Endocrine: Negative for polydipsia and polyuria.  Genitourinary: Negative for dysuria, urgency, hematuria and decreased urine volume.  Musculoskeletal: Positive for back pain and arthralgias. Negative for myalgias and gait problem.  Skin: Negative for color change and rash.  Neurological: Negative for dizziness, light-headedness, numbness and headaches.  Psychiatric/Behavioral: Negative for sleep disturbance and dysphoric mood.    Social History   Social History  . Marital Status: Single    Spouse Name: N/A  . Number of Children: N/A  . Years of Education: N/A   Occupational History  . Not on file.   Social History Main Topics  . Smoking status: Former Smoker    Quit date: 03/20/1978  . Smokeless tobacco: Not on file  . Alcohol Use: 12.6 oz/week    21 Standard drinks or equivalent per week  . Drug Use: No  . Sexual Activity: Not on file   Other Topics Concern  . Not on file   Social History Narrative    Patient Active Problem List   Diagnosis Date Noted  . Pulmonary nodule, right 05/17/2015  . Edema leg 02/02/2015  . AAA (abdominal aortic aneurysm) without rupture (HCC) 07/28/2014  . Benign essential HTN 07/28/2014  . Reflux esophagitis 03/28/2010  . Hyperlipidemia 01/04/2010  . 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 Cataract extraction Bilateral   . Rhinoplasty    . Colonoscopy  2011    His family history includes Heart failure in his brother and mother; Stroke in his father.    Previous Medications   AMLODIPINE (NORVASC) 5 MG TABLET    Take 1 tablet by mouth daily.   ASPIRIN EC 81 MG TABLET    Take 1 tablet by mouth daily.   ATORVASTATIN (LIPITOR) 10 MG TABLET    Take 1 tablet by mouth at bedtime.   CARVEDILOL (COREG) 3.125 MG TABLET    Take 1 tablet by mouth 2 (two) times daily.     VALSARTAN (DIOVAN) 320 MG TABLET    Take 1 tablet by mouth daily.    Patient Care Team: Lamar Blinks, MD as Consulting Physician (Cardiology) Renford Dills, MD (Vascular Surgery)     Objective:   Vitals: BP 176/98 mmHg  Pulse 84  Ht 5' 5.5" (1.664 m)  Wt 153 lb 9.6 oz (69.673 kg)  BMI 25.16 kg/m2  Physical Exam  Constitutional: He is oriented to person, place, and time. He appears well-developed. No distress.  HENT:  Head: Normocephalic and atraumatic.  Eyes: Pupils are equal, round, and reactive to light.  Neck: Normal range of motion. Neck supple. Carotid bruit is not present.  Cardiovascular: Normal rate, regular rhythm and normal heart sounds.   Pulmonary/Chest: Effort normal and breath sounds normal. No respiratory distress. Right breast exhibits no mass. Left breast exhibits no mass.  Abdominal: Soft. Normal appearance and bowel sounds are normal. There is no hepatosplenomegaly. There is no tenderness. There is no CVA tenderness.  Musculoskeletal: Normal range of motion.  Lymphadenopathy:    He has no cervical adenopathy.  Neurological: He is alert and oriented to person, place, and time.  Skin: Skin is warm and dry. No rash noted.  Psychiatric: He has a normal mood and affect. His speech is normal and behavior is normal. Thought content normal.  Nursing note and vitals reviewed.     Visual Acuity Screening   Right eye Left eye Both eyes  Without correction: 20/30 20/30 20/25   With correction:       Activities of Daily Living  In your present state of health, do you have any difficulty performing the following activities: 05/17/2015  Hearing? N  Vision? N  Difficulty concentrating or making decisions? N  Walking or climbing stairs? N  Dressing or bathing? N  Doing errands, shopping? N    Fall Risk Assessment Fall Risk  05/17/2015  Falls in the past year? No      Depression Screen PHQ 2/9 Scores 05/17/2015  PHQ - 2 Score 0    Cognitive Testing  - 6-CIT   Correct? Score   What year is it? yes 0 Yes = 0    No = 4  What month is it? yes 0 Yes = 0    No = 3  Remember:     Floyde Parkins, [address], [place]     What time is it? yes 0 Yes = 0    No = 3  Count backwards from 20 to 1 yes 0 Correct = 0    1 error = 2   More than 1 error = 4  Say the months of the year in reverse. yes 0 Correct = 0    1 error = 2   More than 1 error = 4  What address did I ask you to remember? yes 0 Correct = 0  1 error = 2    2 error = 4    3 error = 6    4 error = 8    All wrong = 10       TOTAL SCORE  0/28   Interpretation:  Normal  Normal (0-7) Abnormal (8-28)      Medicare Initial Preventative Physical Exam  Reviewed patient's Family Medical History Reviewed and updated list of patient's medical providers Assessment of cognitive impairment was done Assessed patient's functional ability Established a written schedule for health screening services Health Risk Assessent Completed and Reviewed  Exercise Activities and Dietary recommendations Goals    None      Immunization History  Administered Date(s) Administered  . Influenza Whole 12/07/2009  . Influenza-Unspecified 03/18/2015  . Pneumococcal Conjugate-13 05/17/2015  . Td 12/07/2009    Health Maintenance  Topic Date Due  . Hepatitis C Screening  11/14/2015 (Originally 1950/09/17)  . ZOSTAVAX  05/16/2020 (Originally 03/04/2010)  . HIV Screening  05/16/2020 (Originally 03/04/1965)  . INFLUENZA VACCINE  10/19/2015  . PNA vac Low Risk Adult (2 of 2 - PPSV23) 05/16/2016  . COLONOSCOPY  08/19/2019  . TETANUS/TDAP  12/08/2019      Discussed health benefits of physical activity, and encouraged him to engage in regular exercise appropriate for his age and condition.    ------------------------------------------------------------------------------------------------------------   Assessment & Plan:  1. Medicare annual wellness visit, initial Measures satisfied  2. Benign  essential HTN Need additional therapy - intolerant to higher dose amlodipine - CBC with Differential/Platelet - Comprehensive metabolic panel - POCT urinalysis dipstick - TSH - hydrochlorothiazide (HYDRODIURIL) 25 MG tablet; Take 1 tablet (25 mg total) by mouth daily.  Dispense: 90 tablet; Refill: 3  3. AAA (abdominal aortic aneurysm) without rupture (HCC) Followed by Vascular surgery  4. Hyperlipidemia On statin therapy - Lipid panel  5. Prostate cancer screening DRE deferred to lack of symptoms - PSA  6. Need for pneumococcal vaccination - Pneumococcal conjugate vaccine 13-valent IM   Bari Edward, MD [facility]  05/17/2015

## 2015-05-18 ENCOUNTER — Telehealth: Payer: Self-pay

## 2015-05-18 LAB — CBC WITH DIFFERENTIAL/PLATELET
BASOS ABS: 0 10*3/uL (ref 0.0–0.2)
Basos: 0 %
EOS (ABSOLUTE): 0.2 10*3/uL (ref 0.0–0.4)
Eos: 3 %
HEMOGLOBIN: 15.7 g/dL (ref 12.6–17.7)
Hematocrit: 46.1 % (ref 37.5–51.0)
Immature Grans (Abs): 0 10*3/uL (ref 0.0–0.1)
Immature Granulocytes: 1 %
LYMPHS ABS: 2 10*3/uL (ref 0.7–3.1)
Lymphs: 32 %
MCH: 33.1 pg — AB (ref 26.6–33.0)
MCHC: 34.1 g/dL (ref 31.5–35.7)
MCV: 97 fL (ref 79–97)
MONOCYTES: 10 %
MONOS ABS: 0.7 10*3/uL (ref 0.1–0.9)
NEUTROS ABS: 3.4 10*3/uL (ref 1.4–7.0)
Neutrophils: 54 %
PLATELETS: 190 10*3/uL (ref 150–379)
RBC: 4.75 x10E6/uL (ref 4.14–5.80)
RDW: 14.4 % (ref 12.3–15.4)
WBC: 6.3 10*3/uL (ref 3.4–10.8)

## 2015-05-18 LAB — COMPREHENSIVE METABOLIC PANEL
ALBUMIN: 4.9 g/dL — AB (ref 3.6–4.8)
ALK PHOS: 84 IU/L (ref 39–117)
ALT: 15 IU/L (ref 0–44)
AST: 21 IU/L (ref 0–40)
Albumin/Globulin Ratio: 1.7 (ref 1.1–2.5)
BILIRUBIN TOTAL: 1.2 mg/dL (ref 0.0–1.2)
BUN/Creatinine Ratio: 14 (ref 10–22)
BUN: 12 mg/dL (ref 8–27)
CO2: 24 mmol/L (ref 18–29)
CREATININE: 0.87 mg/dL (ref 0.76–1.27)
Calcium: 9.6 mg/dL (ref 8.6–10.2)
Chloride: 100 mmol/L (ref 96–106)
GFR calc Af Amer: 105 mL/min/{1.73_m2} (ref >59)
GFR, EST NON AFRICAN AMERICAN: 91 mL/min/{1.73_m2} (ref >59)
GLUCOSE: 116 mg/dL — AB (ref 65–99)
Globulin, Total: 2.9 g/dL (ref 1.5–4.5)
Potassium: 4.5 mmol/L (ref 3.5–5.2)
Sodium: 141 mmol/L (ref 134–144)
Total Protein: 7.8 g/dL (ref 6.0–8.5)

## 2015-05-18 LAB — LIPID PANEL
CHOL/HDL RATIO: 3.3 ratio (ref 0.0–5.0)
Cholesterol, Total: 194 mg/dL (ref 100–199)
HDL: 59 mg/dL (ref >39)
LDL CALC: 100 mg/dL — AB (ref 0–99)
TRIGLYCERIDES: 173 mg/dL — AB (ref 0–149)
VLDL CHOLESTEROL CAL: 35 mg/dL (ref 5–40)

## 2015-05-18 LAB — PSA: Prostate Specific Ag, Serum: 2.1 ng/mL (ref 0.0–4.0)

## 2015-05-18 LAB — TSH: TSH: 2.58 u[IU]/mL (ref 0.450–4.500)

## 2015-05-18 NOTE — Telephone Encounter (Signed)
-----   Message from Reubin Milan, MD sent at 05/18/2015 12:44 PM EST ----- Labs are normal.  Liver function and kidney function is normal.  Cholesterol is good.  PSA is normal.

## 2015-05-18 NOTE — Telephone Encounter (Signed)
Left message for patient to call back  

## 2015-05-19 NOTE — Telephone Encounter (Signed)
Spoke with patient. Patient advised of all results and verbalized understanding. Will call back with any future questions or concerns. MAH  

## 2015-07-15 ENCOUNTER — Ambulatory Visit (INDEPENDENT_AMBULATORY_CARE_PROVIDER_SITE_OTHER): Payer: Commercial Managed Care - HMO | Admitting: Internal Medicine

## 2015-07-15 ENCOUNTER — Encounter: Payer: Self-pay | Admitting: Internal Medicine

## 2015-07-15 VITALS — BP 112/80 | HR 76 | Ht 65.5 in | Wt 152.0 lb

## 2015-07-15 DIAGNOSIS — E785 Hyperlipidemia, unspecified: Secondary | ICD-10-CM | POA: Diagnosis not present

## 2015-07-15 DIAGNOSIS — I714 Abdominal aortic aneurysm, without rupture, unspecified: Secondary | ICD-10-CM

## 2015-07-15 DIAGNOSIS — I10 Essential (primary) hypertension: Secondary | ICD-10-CM

## 2015-07-15 MED ORDER — ATORVASTATIN CALCIUM 10 MG PO TABS: 10.0000 mg | ORAL_TABLET | Freq: Every day | ORAL | Status: DC

## 2015-07-15 NOTE — Progress Notes (Signed)
Date:  07/15/2015   Name:  Trevor Werner   DOB:  June 19, 1950   MRN:  [id number]   Chief Complaint: Follow-up and Hypertension Hypertension This is a chronic problem. The current episode started more than 1 year ago. The problem has been gradually improving since onset. The problem is controlled. Pertinent negatives include no chest pain, headaches, palpitations or shortness of breath. Past treatments include angiotensin blockers, calcium channel blockers, beta blockers and diuretics. The current treatment provides significant improvement.  He has had some episodes of low blood pressure since adding HCTZ.  One time it was 88/60.  He has continued the same regimen.  He does feel slightly lightheaded with rapid change in position.  Lab Results  Component Value Date   CREATININE 0.87 05/17/2015   Lab Results  Component Value Date   CHOL 194 05/17/2015   HDL 59 05/17/2015   LDLCALC 100* 05/17/2015   LDLDIRECT 164.8 12/09/2009   TRIG 173* 05/17/2015   CHOLHDL 3.3 05/17/2015     Review of Systems  Constitutional: Negative for fever and chills.  Respiratory: Negative for cough, chest tightness and shortness of breath.   Cardiovascular: Positive for leg swelling (improved with reduced dose of amlodipine). Negative for chest pain and palpitations.  Skin: Negative for rash.  Neurological: Positive for light-headedness. Negative for dizziness, syncope, weakness and headaches.  Psychiatric/Behavioral: Negative for sleep disturbance and dysphoric mood.    Patient Active Problem List   Diagnosis Date Noted  . Pulmonary nodule, right 05/17/2015  . Edema leg 02/02/2015  . AAA (abdominal aortic aneurysm) without rupture (HCC) 07/28/2014  . Benign essential HTN 07/28/2014  . Reflux esophagitis 03/28/2010  . Hyperlipidemia 01/04/2010  . 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 Authorizing Provider  amLODipine (NORVASC) 5  MG tablet Take 1 tablet by mouth daily. 01/07/15  Yes Historical Provider, MD  aspirin EC 81 MG tablet Take 1 tablet by mouth daily.   Yes Historical Provider, MD  atorvastatin (LIPITOR) 10 MG tablet Take 1 tablet by mouth at bedtime. 08/12/14 08/12/15 Yes Historical Provider, MD  carvedilol (COREG) 3.125 MG tablet Take 1 tablet by mouth 2 (two) times daily. 08/12/14 08/12/15 Yes Historical Provider, MD  hydrochlorothiazide (HYDRODIURIL) 25 MG tablet Take 1 tablet (25 mg total) by mouth daily. 05/17/15  Yes Reubin MilanLaura H Chapman Matteucci, MD  valsartan (DIOVAN) 320 MG tablet Take 1 tablet by mouth daily. 03/03/15 03/02/16 Yes Historical Provider, MD    No Known Allergies  Past Surgical History  Procedure Laterality Date  . Cataract extraction Bilateral   . Rhinoplasty    . Colonoscopy  2011    Social History  Substance Use Topics  . Smoking status: Former Smoker    Quit date: 03/20/1978  . Smokeless tobacco: None  . Alcohol Use: 12.6 oz/week    21 Standard drinks or equivalent per week    Medication list has been reviewed and updated.   Physical Exam  Constitutional: He is oriented to person, place, and time. He appears well-developed. No distress.  HENT:  Head: Normocephalic and atraumatic.  Neck: Normal range of motion. Neck supple.  Cardiovascular: Normal rate, regular rhythm and normal heart sounds.   Pulmonary/Chest: Effort normal and breath sounds normal. No respiratory distress. He has no wheezes.  Musculoskeletal: Normal range of motion. He exhibits edema. He exhibits no tenderness.  Lymphadenopathy:    He has no cervical adenopathy.  Neurological: He is alert  and oriented to person, place, and time.  Skin: Skin is warm and dry. No rash noted.  Psychiatric: He has a normal mood and affect. His behavior is normal. Thought content normal.    BP 112/80 mmHg  Pulse 76  Ht 5' 5.5" (1.664 m)  Wt 152 lb (68.947 kg)  BMI 24.90 kg/m2  Assessment and Plan: 1. Benign essential  HTN Improved with some low readings Reduce HCTZ to 12.5 mg per day and monitor BP If BP increases again, consider HCTZ 25 mg and reduce amlodipine to 2.5 mg.  2. Hyperlipidemia Doing well on statin therapy - recent labs good - atorvastatin (LIPITOR) 10 MG tablet; Take 1 tablet (10 mg total) by mouth at bedtime.  Dispense: 30 tablet; Refill: 5  3. AAA (abdominal aortic aneurysm) without rupture (HCC) Followed by Cardiology Goal BP < 120/80   Bari Edward, MD [facility]  07/15/2015

## 2015-08-19 ENCOUNTER — Other Ambulatory Visit: Payer: Self-pay | Admitting: Vascular Surgery

## 2015-08-19 DIAGNOSIS — I714 Abdominal aortic aneurysm, without rupture, unspecified: Secondary | ICD-10-CM

## 2015-08-19 DIAGNOSIS — I712 Thoracic aortic aneurysm, without rupture, unspecified: Secondary | ICD-10-CM

## 2015-09-02 ENCOUNTER — Other Ambulatory Visit: Payer: PRIVATE HEALTH INSURANCE

## 2015-09-02 ENCOUNTER — Ambulatory Visit: Payer: PRIVATE HEALTH INSURANCE

## 2015-09-07 ENCOUNTER — Ambulatory Visit
Admission: RE | Admit: 2015-09-07 | Discharge: 2015-09-07 | Disposition: A | Discharge status: Home / Self Care | Payer: Commercial Managed Care - HMO | Source: Ambulatory Visit | Attending: Vascular Surgery | Admitting: Vascular Surgery

## 2015-09-07 DIAGNOSIS — I712 Thoracic aortic aneurysm, without rupture, unspecified: Secondary | ICD-10-CM

## 2015-09-07 DIAGNOSIS — I77811 Abdominal aortic ectasia: Secondary | ICD-10-CM | POA: Diagnosis not present

## 2015-09-07 DIAGNOSIS — K8020 Calculus of gallbladder without cholecystitis without obstruction: Secondary | ICD-10-CM | POA: Insufficient documentation

## 2015-09-07 DIAGNOSIS — R918 Other nonspecific abnormal finding of lung field: Secondary | ICD-10-CM | POA: Diagnosis not present

## 2015-09-07 DIAGNOSIS — I714 Abdominal aortic aneurysm, without rupture, unspecified: Secondary | ICD-10-CM

## 2015-09-07 LAB — POCT I-STAT CREATININE: Creatinine, Ser: 1.1 mg/dL (ref 0.61–1.24)

## 2015-09-07 MED ORDER — IOPAMIDOL (ISOVUE-370) INJECTION 76%
100.0000 mL | Freq: Once | INTRAVENOUS | Status: AC | PRN
  Administered 2015-09-07: 100 mL via INTRAVENOUS

## 2015-09-09 DIAGNOSIS — I712 Thoracic aortic aneurysm, without rupture: Secondary | ICD-10-CM | POA: Diagnosis not present

## 2015-09-09 DIAGNOSIS — E785 Hyperlipidemia, unspecified: Secondary | ICD-10-CM | POA: Diagnosis not present

## 2015-09-09 DIAGNOSIS — I716 Thoracoabdominal aortic aneurysm, without rupture: Secondary | ICD-10-CM | POA: Diagnosis not present

## 2015-09-09 DIAGNOSIS — I714 Abdominal aortic aneurysm, without rupture: Secondary | ICD-10-CM | POA: Diagnosis not present

## 2015-09-09 DIAGNOSIS — I10 Essential (primary) hypertension: Secondary | ICD-10-CM | POA: Diagnosis not present

## 2015-09-14 ENCOUNTER — Other Ambulatory Visit: Payer: Self-pay | Admitting: Internal Medicine

## 2015-09-14 ENCOUNTER — Telehealth: Payer: Self-pay

## 2015-09-14 MED ORDER — CARVEDILOL 6.25 MG PO TABS: 6.2500 mg | ORAL_TABLET | Freq: Two times a day (BID) | ORAL | Status: DC

## 2015-09-14 MED ORDER — VALSARTAN 320 MG PO TABS: 320.0000 mg | ORAL_TABLET | Freq: Every day | ORAL | Status: DC

## 2015-09-14 NOTE — Telephone Encounter (Signed)
Patient has been getting Carvedilol filled by [facility] and like the Atorvastatin he would like you to refill this. He said you did his Atorvastatin so should not be issue to do BP meds. I advised him to check with pharmacy later and we will ONLY call if there is an issue.

## 2015-10-27 DIAGNOSIS — Z8249 Family history of ischemic heart disease and other diseases of the circulatory system: Secondary | ICD-10-CM | POA: Diagnosis not present

## 2015-10-27 DIAGNOSIS — I712 Thoracic aortic aneurysm, without rupture: Secondary | ICD-10-CM | POA: Diagnosis not present

## 2015-10-27 DIAGNOSIS — I714 Abdominal aortic aneurysm, without rupture: Secondary | ICD-10-CM | POA: Diagnosis not present

## 2015-10-27 DIAGNOSIS — I10 Essential (primary) hypertension: Secondary | ICD-10-CM | POA: Diagnosis not present

## 2015-10-27 DIAGNOSIS — K8020 Calculus of gallbladder without cholecystitis without obstruction: Secondary | ICD-10-CM | POA: Diagnosis not present

## 2015-10-27 DIAGNOSIS — I716 Thoracoabdominal aortic aneurysm, without rupture: Secondary | ICD-10-CM | POA: Diagnosis not present

## 2015-10-27 DIAGNOSIS — I6523 Occlusion and stenosis of bilateral carotid arteries: Secondary | ICD-10-CM | POA: Diagnosis not present

## 2015-10-27 DIAGNOSIS — Q272 Other congenital malformations of renal artery: Secondary | ICD-10-CM | POA: Diagnosis not present

## 2015-10-27 DIAGNOSIS — I2510 Atherosclerotic heart disease of native coronary artery without angina pectoris: Secondary | ICD-10-CM | POA: Diagnosis not present

## 2015-10-27 DIAGNOSIS — Z7982 Long term (current) use of aspirin: Secondary | ICD-10-CM | POA: Diagnosis not present

## 2015-10-27 DIAGNOSIS — E785 Hyperlipidemia, unspecified: Secondary | ICD-10-CM | POA: Diagnosis not present

## 2015-10-27 DIAGNOSIS — Z87891 Personal history of nicotine dependence: Secondary | ICD-10-CM | POA: Diagnosis not present

## 2015-11-01 DIAGNOSIS — I2510 Atherosclerotic heart disease of native coronary artery without angina pectoris: Secondary | ICD-10-CM | POA: Insufficient documentation

## 2016-01-16 ENCOUNTER — Other Ambulatory Visit: Payer: Self-pay | Admitting: Internal Medicine

## 2016-01-16 DIAGNOSIS — E785 Hyperlipidemia, unspecified: Secondary | ICD-10-CM

## 2016-03-17 ENCOUNTER — Other Ambulatory Visit: Payer: Self-pay | Admitting: Internal Medicine

## 2016-05-15 ENCOUNTER — Other Ambulatory Visit: Payer: Self-pay | Admitting: Internal Medicine

## 2016-05-17 ENCOUNTER — Ambulatory Visit (INDEPENDENT_AMBULATORY_CARE_PROVIDER_SITE_OTHER): Payer: Commercial Managed Care - HMO | Admitting: Internal Medicine

## 2016-05-17 ENCOUNTER — Encounter: Payer: Self-pay | Admitting: Internal Medicine

## 2016-05-17 VITALS — BP 132/82 | HR 70 | Ht 65.0 in | Wt 152.6 lb

## 2016-05-17 DIAGNOSIS — Z0000 Encounter for general adult medical examination without abnormal findings: Secondary | ICD-10-CM

## 2016-05-17 DIAGNOSIS — Z125 Encounter for screening for malignant neoplasm of prostate: Secondary | ICD-10-CM

## 2016-05-17 DIAGNOSIS — Z23 Encounter for immunization: Secondary | ICD-10-CM | POA: Diagnosis not present

## 2016-05-17 DIAGNOSIS — I714 Abdominal aortic aneurysm, without rupture, unspecified: Secondary | ICD-10-CM

## 2016-05-17 DIAGNOSIS — K2100 Gastro-esophageal reflux disease with esophagitis, without bleeding: Secondary | ICD-10-CM

## 2016-05-17 DIAGNOSIS — E782 Mixed hyperlipidemia: Secondary | ICD-10-CM | POA: Diagnosis not present

## 2016-05-17 DIAGNOSIS — I10 Essential (primary) hypertension: Secondary | ICD-10-CM | POA: Diagnosis not present

## 2016-05-17 LAB — POCT URINALYSIS DIPSTICK
BILIRUBIN UA: NEGATIVE
Glucose, UA: NEGATIVE
KETONES UA: NEGATIVE
Leukocytes, UA: NEGATIVE
Nitrite, UA: NEGATIVE
PROTEIN UA: NEGATIVE
SPEC GRAV UA: 1.01
Urobilinogen, UA: 1
pH, UA: 7

## 2016-05-17 MED ORDER — AMLODIPINE BESYLATE 5 MG PO TABS: 5.0000 mg | ORAL_TABLET | Freq: Every day | ORAL | 3 refills | Status: DC

## 2016-05-17 MED ORDER — CARVEDILOL 6.25 MG PO TABS: 6.2500 mg | ORAL_TABLET | Freq: Two times a day (BID) | ORAL | 3 refills | Status: DC

## 2016-05-17 MED ORDER — VALSARTAN 320 MG PO TABS: 320.0000 mg | ORAL_TABLET | Freq: Every day | ORAL | 3 refills | Status: DC

## 2016-05-17 MED ORDER — ATORVASTATIN CALCIUM 10 MG PO TABS: 10.0000 mg | ORAL_TABLET | Freq: Every day | ORAL | 3 refills | Status: DC

## 2016-05-17 MED ORDER — HYDROCHLOROTHIAZIDE 25 MG PO TABS: 25.0000 mg | ORAL_TABLET | Freq: Every day | ORAL | 3 refills | Status: DC

## 2016-05-17 NOTE — Patient Instructions (Signed)
Health Maintenance  Topic Date Due  . PNA vac Low Risk Adult (2 of 2 - PPSV23) Completed today  . Hepatitis C Screening  05/17/2021 (Originally June 11, 1950)  . COLONOSCOPY  08/19/2019  . TETANUS/TDAP  12/08/2019  . INFLUENZA VACCINE  Completed

## 2016-05-17 NOTE — Progress Notes (Addendum)
Patient: Trevor Werner, Male    DOB: 10-30-1950, 66 y.o.   MRN: [id number] Visit Date: 05/17/2016  Today's Provider: Bari Edward, MD   Chief Complaint  Patient presents with  . Annual Exam    Wants to discuss bp medications.   Subjective:    Annual wellness visit Trevor Werner is a 66 y.o. male who presents today for his Subsequent Annual Wellness Visit. He feels well. He reports exercising some walking 2.5 miles. He reports he is sleeping well.   ----------------------------------------------------------- Hypertension  This is a chronic problem. The problem is unchanged. The problem is controlled. Pertinent negatives include no chest pain, headaches, palpitations or shortness of breath.  Hyperlipidemia  This is a chronic problem. The problem is controlled. Recent lipid tests were reviewed and are normal. Pertinent negatives include no chest pain, myalgias or shortness of breath. Current antihyperlipidemic treatment includes statins.  AAA - seen by specialist at [facility].  Will be followed every 6 months.  He continues tobacco free, taking his BP and statin medications as directed.  Per patient, the AAA has not changed.  Review of Systems  Constitutional: Negative for appetite change, chills, diaphoresis, fatigue and unexpected weight change.  HENT: Negative for hearing loss, tinnitus, trouble swallowing and voice change.   Eyes: Negative for visual disturbance.  Respiratory: Negative for choking, shortness of breath and wheezing.   Cardiovascular: Negative for chest pain, palpitations and leg swelling.  Gastrointestinal: Negative for abdominal pain, blood in stool, constipation and diarrhea.  Genitourinary: Negative for difficulty urinating, dysuria, frequency and hematuria.  Musculoskeletal: Negative for arthralgias, back pain and myalgias.  Skin: Negative for color change and rash.  Allergic/Immunologic: Negative for environmental allergies.  Neurological: Negative for dizziness,  syncope and headaches.  Hematological: Negative for adenopathy.  Psychiatric/Behavioral: Negative for dysphoric mood and sleep disturbance.    Social History   Social History  . Marital status: Single    Spouse name: N/A  . Number of children: N/A  . Years of education: N/A   Occupational History  . Not on file.   Social History Main Topics  . Smoking status: Former Smoker    Quit date: 03/20/1978  . Smokeless tobacco: Never Used  . Alcohol use 12.6 oz/week    21 Standard drinks or equivalent per week  . Drug use: Yes    Types: Methaqualone  . Sexual activity: Not on file   Other Topics Concern  . Not on file   Social History Narrative  . No narrative on file    Patient Active Problem List   Diagnosis Date Noted  . CAD (coronary artery disease) 11/01/2015  . Pulmonary nodule, right 05/17/2015  . Edema leg 02/02/2015  . AAA (abdominal aortic aneurysm) without rupture (HCC) 07/28/2014  . Benign essential HTN 07/28/2014  . Reflux esophagitis 03/28/2010  . Hyperlipidemia 01/04/2010  . 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 CATARACT EXTRACTION Bilateral   . COLONOSCOPY  2011  . RHINOPLASTY      His family history includes Heart failure in his brother and mother; Stroke in his father.     Previous Medications   ASPIRIN EC 81 MG TABLET    Take 1 tablet by mouth daily.    Patient Care Team: Reubin Milan, MD as PCP - General (Family Medicine) Lamar Blinks, MD as Consulting Physician (Cardiology) Renford Dills, MD (Vascular Surgery) Suszanne Finch, MD as Attending Physician (Vascular Surgery)  Objective:   Vitals: BP 132/82   Pulse 70   Ht 5\' 5"  (1.651 m)   Wt 152 lb 9.6 oz (69.2 kg)   SpO2 100%   BMI 25.39 kg/m   Physical Exam  Constitutional: He is oriented to person, place, and time. He appears well-developed and well-nourished. No distress.  HENT:  Head: Normocephalic and atraumatic.    Right Ear: Tympanic membrane, external ear and ear canal normal.  Left Ear: Tympanic membrane, external ear and ear canal normal.  Nose: Nose normal.  Mouth/Throat: Uvula is midline and oropharynx is clear and moist.  Eyes: Conjunctivae and EOM are normal. Pupils are equal, round, and reactive to light.  Neck: Normal range of motion. Neck supple. Carotid bruit is not present. No thyromegaly present.  Cardiovascular: Normal rate, regular rhythm, normal heart sounds and intact distal pulses.   Pulmonary/Chest: Effort normal and breath sounds normal. No respiratory distress. He has no wheezes. Right breast exhibits no mass. Left breast exhibits no mass.  Abdominal: Soft. Normal appearance and bowel sounds are normal. There is no hepatosplenomegaly. There is no tenderness.  Musculoskeletal: Normal range of motion.  Lymphadenopathy:    He has no cervical adenopathy.  Neurological: He is alert and oriented to person, place, and time. He has normal reflexes.  Skin: Skin is warm, dry and intact. No rash noted.  Psychiatric: He has a normal mood and affect. His speech is normal and behavior is normal. Judgment and thought content normal.  Nursing note and vitals reviewed.   Activities of Daily Living In your present state of health, do you have any difficulty performing the following activities: 05/17/2016  Hearing? Y  Vision? N  Difficulty concentrating or making decisions? N  Walking or climbing stairs? N  Dressing or bathing? N  Doing errands, shopping? N  Preparing Food and eating ? N  Using the Toilet? N  In the past six months, have you accidently leaked urine? N  Do you have problems with loss of bowel control? N  Managing your Medications? N  Managing your Finances? N  Housekeeping or managing your Housekeeping? N  Some recent data might be hidden    Fall Risk Assessment Fall Risk  05/17/2016 05/17/2016 05/17/2015  Falls in the past year? No No No     Depression Screen PHQ 2/9  Scores 05/17/2016 05/17/2016 05/17/2015  PHQ - 2 Score 0 0 0   6CIT Screen 05/17/2016  What Year? 0 points  What month? 0 points  What time? 0 points  Count back from 20 0 points  Months in reverse 2 points     Medicare Annual Wellness Visit Summary:  Reviewed patient's Family Medical History Reviewed and updated list of patient's medical providers Assessment of cognitive impairment was done Assessed patient's functional ability Established a written schedule for health screening services Health Risk Assessent Completed and Reviewed  Exercise Activities and Dietary recommendations Goals    None      Immunization History  Administered Date(s) Administered  . Influenza Whole 12/07/2009  . Influenza-Unspecified 03/18/2015, 01/21/2016  . Pneumococcal Conjugate-13 05/17/2015  . Pneumococcal Polysaccharide-23 05/17/2016  . Td 12/07/2009    Health Maintenance  Topic Date Due  . PNA vac Low Risk Adult (2 of 2 - PPSV23) 05/16/2016  . Hepatitis C Screening  05/17/2021 (Originally Aug 05, 1950)  . COLONOSCOPY  08/19/2019  . TETANUS/TDAP  12/08/2019  . INFLUENZA VACCINE  Completed    Discussed health benefits of physical activity, and encouraged him to engage  in regular exercise appropriate for his age and condition.    ------------------------------------------------------------------------------------------------------------  Assessment & Plan:   1. Medicare annual wellness visit, subsequent Measures satisfied UA dipstick trace blood - microscopic negative. - POCT urinalysis dipstick  2. Benign essential HTN controlled - hydrochlorothiazide (HYDRODIURIL) 25 MG tablet; Take 1 tablet (25 mg total) by mouth daily.  Dispense: 90 tablet; Refill: 3 - CBC with Differential/Platelet - Comprehensive metabolic panel  3. Mixed hyperlipidemia Continue statin - atorvastatin (LIPITOR) 10 MG tablet; Take 1 tablet (10 mg total) by mouth at bedtime.  Dispense: 90 tablet; Refill: 3 -  Lipid panel  4. Reflux esophagitis intermittent  5. AAA (abdominal aortic aneurysm) without rupture (HCC) Follow up with [facility]  6. Need for pneumococcal vaccination - Pneumococcal polysaccharide vaccine 23-valent greater than or equal to 2yo subcutaneous/IM  7. Prostate cancer screening - PSA   Meds ordered this encounter  Medications  . atorvastatin (LIPITOR) 10 MG tablet    Sig: Take 1 tablet (10 mg total) by mouth at bedtime.    Dispense:  90 tablet    Refill:  3    Please consider 90 day supplies to promote better adherence  . carvedilol (COREG) 6.25 MG tablet    Sig: Take 1 tablet (6.25 mg total) by mouth 2 (two) times daily.    Dispense:  180 tablet    Refill:  3    Please consider 90 day supplies to promote better adherence  . hydrochlorothiazide (HYDRODIURIL) 25 MG tablet    Sig: Take 1 tablet (25 mg total) by mouth daily.    Dispense:  90 tablet    Refill:  3  . valsartan (DIOVAN) 320 MG tablet    Sig: Take 1 tablet (320 mg total) by mouth daily.    Dispense:  90 tablet    Refill:  3  . amLODipine (NORVASC) 5 MG tablet    Sig: Take 1 tablet (5 mg total) by mouth daily.    Dispense:  90 tablet    Refill:  3    Bari Edward, MD [facility]  05/17/2016

## 2016-05-18 ENCOUNTER — Encounter: Payer: Self-pay | Admitting: Internal Medicine

## 2016-05-18 DIAGNOSIS — R972 Elevated prostate specific antigen [PSA]: Secondary | ICD-10-CM | POA: Insufficient documentation

## 2016-05-18 LAB — CBC WITH DIFFERENTIAL/PLATELET
BASOS: 1 %
Basophils Absolute: 0 10*3/uL (ref 0.0–0.2)
EOS (ABSOLUTE): 0.2 10*3/uL (ref 0.0–0.4)
EOS: 2 %
HEMATOCRIT: 46.4 % (ref 37.5–51.0)
HEMOGLOBIN: 15.8 g/dL (ref 13.0–17.7)
IMMATURE GRANULOCYTES: 0 %
Immature Grans (Abs): 0 10*3/uL (ref 0.0–0.1)
Lymphocytes Absolute: 1.9 10*3/uL (ref 0.7–3.1)
Lymphs: 31 %
MCH: 33.1 pg — ABNORMAL HIGH (ref 26.6–33.0)
MCHC: 34.1 g/dL (ref 31.5–35.7)
MCV: 97 fL (ref 79–97)
MONOCYTES: 12 %
Monocytes Absolute: 0.7 10*3/uL (ref 0.1–0.9)
NEUTROS PCT: 54 %
Neutrophils Absolute: 3.3 10*3/uL (ref 1.4–7.0)
Platelets: 198 10*3/uL (ref 150–379)
RBC: 4.77 x10E6/uL (ref 4.14–5.80)
RDW: 13.5 % (ref 12.3–15.4)
WBC: 6.1 10*3/uL (ref 3.4–10.8)

## 2016-05-18 LAB — COMPREHENSIVE METABOLIC PANEL
ALBUMIN: 4.8 g/dL (ref 3.6–4.8)
ALT: 15 IU/L (ref 0–44)
AST: 19 IU/L (ref 0–40)
Albumin/Globulin Ratio: 1.6 (ref 1.2–2.2)
Alkaline Phosphatase: 76 IU/L (ref 39–117)
BUN / CREAT RATIO: 13 (ref 10–24)
BUN: 14 mg/dL (ref 8–27)
Bilirubin Total: 1.5 mg/dL — ABNORMAL HIGH (ref 0.0–1.2)
CALCIUM: 10.3 mg/dL — AB (ref 8.6–10.2)
CO2: 26 mmol/L (ref 18–29)
CREATININE: 1.06 mg/dL (ref 0.76–1.27)
Chloride: 97 mmol/L (ref 96–106)
GFR, EST AFRICAN AMERICAN: 84 mL/min/{1.73_m2} (ref >59)
GFR, EST NON AFRICAN AMERICAN: 73 mL/min/{1.73_m2} (ref >59)
GLOBULIN, TOTAL: 3 g/dL (ref 1.5–4.5)
Glucose: 110 mg/dL — ABNORMAL HIGH (ref 65–99)
Potassium: 5 mmol/L (ref 3.5–5.2)
SODIUM: 139 mmol/L (ref 134–144)
Total Protein: 7.8 g/dL (ref 6.0–8.5)

## 2016-05-18 LAB — LIPID PANEL
CHOL/HDL RATIO: 3.1 ratio (ref 0.0–5.0)
Cholesterol, Total: 182 mg/dL (ref 100–199)
HDL: 59 mg/dL (ref >39)
LDL CALC: 97 mg/dL (ref 0–99)
TRIGLYCERIDES: 128 mg/dL (ref 0–149)
VLDL Cholesterol Cal: 26 mg/dL (ref 5–40)

## 2016-05-18 LAB — PSA: Prostate Specific Ag, Serum: 3 ng/mL (ref 0.0–4.0)

## 2016-06-08 DIAGNOSIS — I723 Aneurysm of iliac artery: Secondary | ICD-10-CM | POA: Diagnosis not present

## 2016-06-08 DIAGNOSIS — I712 Thoracic aortic aneurysm, without rupture: Secondary | ICD-10-CM | POA: Diagnosis not present

## 2016-06-08 DIAGNOSIS — Q272 Other congenital malformations of renal artery: Secondary | ICD-10-CM | POA: Diagnosis not present

## 2016-06-08 DIAGNOSIS — I10 Essential (primary) hypertension: Secondary | ICD-10-CM | POA: Diagnosis not present

## 2016-06-08 DIAGNOSIS — Z79899 Other long term (current) drug therapy: Secondary | ICD-10-CM | POA: Diagnosis not present

## 2016-06-08 DIAGNOSIS — I714 Abdominal aortic aneurysm, without rupture: Secondary | ICD-10-CM | POA: Diagnosis not present

## 2016-06-08 DIAGNOSIS — H9190 Unspecified hearing loss, unspecified ear: Secondary | ICD-10-CM | POA: Diagnosis not present

## 2016-06-08 DIAGNOSIS — E785 Hyperlipidemia, unspecified: Secondary | ICD-10-CM | POA: Diagnosis not present

## 2016-06-08 DIAGNOSIS — I716 Thoracoabdominal aortic aneurysm, without rupture: Secondary | ICD-10-CM | POA: Diagnosis not present

## 2016-06-08 DIAGNOSIS — H9319 Tinnitus, unspecified ear: Secondary | ICD-10-CM | POA: Diagnosis not present

## 2016-06-08 DIAGNOSIS — I2510 Atherosclerotic heart disease of native coronary artery without angina pectoris: Secondary | ICD-10-CM | POA: Diagnosis not present

## 2016-11-02 ENCOUNTER — Telehealth: Payer: Self-pay

## 2016-11-02 ENCOUNTER — Other Ambulatory Visit: Payer: Self-pay | Admitting: Internal Medicine

## 2016-11-02 MED ORDER — IRBESARTAN 300 MG PO TABS: 300.0000 mg | ORAL_TABLET | Freq: Every day | ORAL | 1 refills | Status: DC

## 2016-11-02 NOTE — Telephone Encounter (Signed)
I sent in irbesartan.  He needs to begin it and have follow up with me for HTN in 30 days.

## 2016-11-02 NOTE — Telephone Encounter (Signed)
Left pt VM to inform he needs to begin medication and call office to schedule follow up in 30 days for hypertension.

## 2016-11-02 NOTE — Telephone Encounter (Signed)
Called pt to inform about Valsartan recall, needing to verify pharmacy and change medication. Patient states he found out about the recall 3 weeks ago and has not taken Valsartan medication since then. States he has been taking hip BP at home and his BP has been ranging around 125/85 consistently. [place] [facility] [place]. Please Advise.

## 2016-11-15 ENCOUNTER — Ambulatory Visit (INDEPENDENT_AMBULATORY_CARE_PROVIDER_SITE_OTHER): Payer: Medicare HMO | Admitting: Internal Medicine

## 2016-11-15 ENCOUNTER — Encounter: Payer: Self-pay | Admitting: Internal Medicine

## 2016-11-15 VITALS — BP 118/78 | HR 63 | Ht 65.0 in | Wt 146.2 lb

## 2016-11-15 DIAGNOSIS — I10 Essential (primary) hypertension: Secondary | ICD-10-CM

## 2016-11-15 DIAGNOSIS — I714 Abdominal aortic aneurysm, without rupture, unspecified: Secondary | ICD-10-CM

## 2016-11-15 DIAGNOSIS — R972 Elevated prostate specific antigen [PSA]: Secondary | ICD-10-CM

## 2016-11-15 DIAGNOSIS — R739 Hyperglycemia, unspecified: Secondary | ICD-10-CM

## 2016-11-15 DIAGNOSIS — R7989 Other specified abnormal findings of blood chemistry: Secondary | ICD-10-CM | POA: Diagnosis not present

## 2016-11-15 DIAGNOSIS — E782 Mixed hyperlipidemia: Secondary | ICD-10-CM

## 2016-11-15 MED ORDER — IRBESARTAN 300 MG PO TABS: 300.0000 mg | ORAL_TABLET | Freq: Every day | ORAL | 3 refills | Status: DC

## 2016-11-15 NOTE — Progress Notes (Signed)
Date:  11/15/2016   Name:  Trevor Werner   DOB:  1950/07/12   MRN:  [id number]   Chief Complaint: Hypertension (Was switched from Valsartan to Irbesartan. ) Hypertension  This is a chronic problem. The problem is controlled. Pertinent negatives include no chest pain, headaches, palpitations or shortness of breath. Past treatments include beta blockers, calcium channel blockers and angiotensin blockers (valsartan stopped - irbesartan added).   Thoracic Aneurysm-has been followed by vascular surgery. Most recent CT showed stable size of aneurysm. It is large enough for surgery however the patient is very reluctant to undergo due to possible complications and need for recurring procedures. He's told his chance of rupture is about 15%. At this time his want to take that chance.  Elevated glucose - glucose was elevated on last labs. No family history of diabetes. Weight is been stable and he denies polyuria.  Rising PSA - last PSA was 50% higher than the previous one. He is due to have a recheck.   Review of Systems  Constitutional: Negative for appetite change, fatigue and unexpected weight change.  Eyes: Negative for visual disturbance.  Respiratory: Negative for cough, chest tightness, shortness of breath and wheezing.   Cardiovascular: Negative for chest pain, palpitations and leg swelling.  Gastrointestinal: Negative for abdominal pain and blood in stool.  Endocrine: Negative for polyuria.  Genitourinary: Negative for difficulty urinating, dysuria and hematuria.  Skin: Negative for color change and rash.  Neurological: Negative for tremors, numbness and headaches.  Psychiatric/Behavioral: Negative for dysphoric mood.    Patient Active Problem List   Diagnosis Date Noted  . Rising PSA level 05/18/2016  . CAD (coronary artery disease) 11/01/2015  . Pulmonary nodule, right 05/17/2015  . Edema leg 02/02/2015  . AAA (abdominal aortic aneurysm) without rupture (HCC) 07/28/2014  .  Benign essential HTN 07/28/2014  . Reflux esophagitis 03/28/2010  . Hyperlipidemia 01/04/2010  . 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 Authorizing Provider  amLODipine (NORVASC) 5 MG tablet Take 1 tablet (5 mg total) by mouth daily. 05/17/16  Yes Reubin MilanBerglund, Maekayla Giorgio H, MD  aspirin EC 81 MG tablet Take 1 tablet by mouth daily.   Yes [provider]  atorvastatin (LIPITOR) 10 MG tablet Take 1 tablet (10 mg total) by mouth at bedtime. 05/17/16  Yes Reubin MilanBerglund, Rasa Degrazia H, MD  carvedilol (COREG) 6.25 MG tablet Take 1 tablet (6.25 mg total) by mouth 2 (two) times daily. 05/17/16  Yes Reubin MilanBerglund, Idil Maslanka H, MD  hydrochlorothiazide (HYDRODIURIL) 25 MG tablet Take 1 tablet (25 mg total) by mouth daily. 05/17/16  Yes Reubin MilanBerglund, Jolicia Delira H, MD  irbesartan (AVAPRO) 300 MG tablet Take 1 tablet (300 mg total) by mouth daily. 11/02/16  Yes Reubin MilanBerglund, Junah Yam H, MD    No Known Allergies  Past Surgical History:  Procedure Laterality Date  . CATARACT EXTRACTION Bilateral   . COLONOSCOPY  2011  . RHINOPLASTY      Social History  Substance Use Topics  . Smoking status: Former Smoker    Quit date: 03/20/1978  . Smokeless tobacco: Never Used  . Alcohol use 12.6 oz/week    21 Standard drinks or equivalent per week     Medication list has been reviewed and updated.  PHQ 2/9 Scores 05/17/2016 05/17/2016 05/17/2015  PHQ - 2 Score 0 0 0    Physical Exam  Constitutional: He is oriented to person, place, and time. He appears well-developed. No distress.  HENT:  Head: Normocephalic and atraumatic.  Neck: Normal range of motion. Neck supple.  Cardiovascular: Normal rate, regular rhythm and normal heart sounds.   Pulmonary/Chest: Effort normal and breath sounds normal. No respiratory distress. He has no wheezes.  Musculoskeletal: He exhibits no edema or tenderness.  Neurological: He is alert and oriented to person, place, and time.  Skin: Skin is  warm and dry. No rash noted.  Psychiatric: He has a normal mood and affect. His behavior is normal. Thought content normal.  Nursing note and vitals reviewed.   BP 130/72   Pulse 63   Ht 5\' 5"  (1.651 m)   Wt 146 lb 3.2 oz (66.3 kg)   SpO2 99%   BMI 24.33 kg/m   Assessment and Plan: 1. Benign essential HTN Controlled, tolerating change to irbesartan - Basic metabolic panel  2. Mixed hyperlipidemia Continue statin therapy  3. AAA (abdominal aortic aneurysm) without rupture (HCC) Stable- pt has declined surgical intervention at this time  4. Rising PSA level Recheck today - PSA  5. Blood glucose elevated Rule out early DM - Hemoglobin A1c   Meds ordered this encounter  Medications  . irbesartan (AVAPRO) 300 MG tablet    Sig: Take 1 tablet (300 mg total) by mouth daily.    Dispense:  90 tablet    Refill:  3    Substitute for valsartan    Partially dictated using Animal nutritionist. Any errors are unintentional.  Bari Edward, MD [facility]  11/15/2016

## 2016-11-16 LAB — BASIC METABOLIC PANEL
BUN/Creatinine Ratio: 12 (ref 10–24)
BUN: 12 mg/dL (ref 8–27)
CALCIUM: 10.1 mg/dL (ref 8.6–10.2)
CO2: 22 mmol/L (ref 20–29)
CREATININE: 1.04 mg/dL (ref 0.76–1.27)
Chloride: 98 mmol/L (ref 96–106)
GFR, EST AFRICAN AMERICAN: 86 mL/min/{1.73_m2} (ref >59)
GFR, EST NON AFRICAN AMERICAN: 74 mL/min/{1.73_m2} (ref >59)
Glucose: 94 mg/dL (ref 65–99)
POTASSIUM: 4.3 mmol/L (ref 3.5–5.2)
Sodium: 139 mmol/L (ref 134–144)

## 2016-11-16 LAB — HEMOGLOBIN A1C
Est. average glucose Bld gHb Est-mCnc: 100 mg/dL
HEMOGLOBIN A1C: 5.1 % (ref 4.8–5.6)

## 2016-11-16 LAB — PSA: Prostate Specific Ag, Serum: 4.2 ng/mL — ABNORMAL HIGH (ref 0.0–4.0)

## 2016-11-16 NOTE — Progress Notes (Signed)
Additional labs added- Awaiting results.

## 2016-12-09 LAB — PSA, TOTAL AND FREE
PROSTATE SPECIFIC AG, SERUM: 4.2 ng/mL — AB (ref 0.0–4.0)
PSA, Free Pct: 17.9 %
PSA, Free: 0.75 ng/mL

## 2016-12-09 LAB — SPECIMEN STATUS REPORT

## 2016-12-11 ENCOUNTER — Other Ambulatory Visit: Payer: Self-pay | Admitting: Internal Medicine

## 2016-12-11 ENCOUNTER — Telehealth: Payer: Self-pay

## 2016-12-11 DIAGNOSIS — R972 Elevated prostate specific antigen [PSA]: Secondary | ICD-10-CM

## 2016-12-11 NOTE — Telephone Encounter (Signed)
Patient informed of labs and elevated PSA> Patient agrees about referral for urology for evaluation. HE stated [place] location is fine.  Please Advise.

## 2017-01-05 ENCOUNTER — Ambulatory Visit (INDEPENDENT_AMBULATORY_CARE_PROVIDER_SITE_OTHER): Payer: Medicare HMO | Admitting: Urology

## 2017-01-05 ENCOUNTER — Other Ambulatory Visit: Payer: Self-pay | Admitting: *Deleted

## 2017-01-05 ENCOUNTER — Encounter: Payer: Self-pay | Admitting: Urology

## 2017-01-05 ENCOUNTER — Other Ambulatory Visit
Admission: RE | Admit: 2017-01-05 | Discharge: 2017-01-05 | Disposition: A | Discharge status: Home / Self Care | Payer: Medicare HMO | Source: Ambulatory Visit | Attending: Urology | Admitting: Urology

## 2017-01-05 VITALS — BP 136/79 | HR 70 | Ht 65.0 in | Wt 148.0 lb

## 2017-01-05 DIAGNOSIS — R350 Frequency of micturition: Secondary | ICD-10-CM

## 2017-01-05 DIAGNOSIS — R972 Elevated prostate specific antigen [PSA]: Secondary | ICD-10-CM | POA: Diagnosis not present

## 2017-01-05 LAB — PSA: PROSTATIC SPECIFIC ANTIGEN: 2.81 ng/mL (ref 0.00–4.00)

## 2017-01-05 NOTE — Progress Notes (Signed)
01/05/2017 2:50 PM   Trevor Werner 10-31-1950 [id number]  Referring provider: Reubin MilanBerglund, Laura H, MD [address] [place], [place] [id number]  Chief Complaint  Patient presents with  . New Patient (Initial Visit)    elevated psa referred by Dr. Judithann GravesBerglund    HPI: 66 year old male referred for further evaluation of elevated PSA.  His most recent PSA was elevated to 4.2, (free PSA 17.8%, 23% risk of prostate cancer). PSA trend as below.  He denies a family history of prostate cancer.  He reports that his brother was worked up several years ago for elevated PSA and found to have prostatic inflammation.  No weight loss or bone pain.  He does have some mild chronic baseline urinary frequency which is unchanged.  He reports is a lifelong issue.  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  He denies any weak or split urinary stream.  Overall, he is pleased with his voiding symptoms.  PSS as below.  Past medical history is significant for thoracic aortic aneurysm, 6.5 cm which he is elected to manage conservatively.  [facility]  He does watch his weight, control his blood pressure, along with other conservative medical management.  PSA: 4.2 11/09/2016 3.0 05/17/2016 2.1 05/17/2015 3.1 07/02/2012       IPSS    Row Name 01/05/17 1400         International Prostate Symptom Score   How often have you had the sensation of not emptying your bladder? Not at All     How often have you had to urinate less than every two hours? Less than 1 in 5 times     How often have you found you stopped and started again several times when you urinated? Less than 1 in 5 times     How often have you found it difficult to postpone urination? Not at All     How often have you had a weak urinary stream? Less than 1 in 5 times     How often have you had to strain to start  urination? Not at All     How many times did you typically get up at night to urinate? 2 Times     Total IPSS Score 5       Quality of Life due to urinary symptoms   If you were to spend the rest of your life with your urinary condition just the way it is now how would you feel about that? Mostly Satisfied        Score:  1-7 Mild 8-19 Moderate 20-35 Severe   PMH: Past Medical History:  Diagnosis Date  . Aneurysm of aorta (HCC)   . Heartburn   . HLD (hyperlipidemia)   . Hypertension     Surgical History: Past Surgical History:  Procedure Laterality Date  . CATARACT EXTRACTION Bilateral   . COLONOSCOPY  2011  . HAND SURGERY Right 1974  . RHINOPLASTY      Home Medications:  Allergies as of 01/05/2017   No Known Allergies     Medication List       Accurate as of 01/05/17  2:50 PM. Always use your most recent med list.          amLODipine 5 MG tablet Commonly known as:  NORVASC Take 1 tablet (5 mg  total) by mouth daily.   aspirin EC 81 MG tablet Take 1 tablet by mouth daily.   atorvastatin 10 MG tablet Commonly known as:  LIPITOR Take 1 tablet (10 mg total) by mouth at bedtime.   carvedilol 6.25 MG tablet Commonly known as:  COREG Take 1 tablet (6.25 mg total) by mouth 2 (two) times daily.   hydrochlorothiazide 25 MG tablet Commonly known as:  HYDRODIURIL Take 1 tablet (25 mg total) by mouth daily.   irbesartan 300 MG tablet Commonly known as:  AVAPRO Take 1 tablet (300 mg total) by mouth daily.       Allergies: No Known Allergies  Family History: Family History  Problem Relation Age of Onset  . Stroke Father   . Heart failure Mother   . Heart failure Brother   . Heart block Sister   . Prostate cancer Neg Hx   . Kidney cancer Neg Hx   . Bladder Cancer Neg Hx     Social History:  reports that he quit smoking about 38 years ago. He has never used smokeless tobacco. He reports that he drinks about 12.6 oz of alcohol per week . He reports  that he does not use drugs.  ROS: UROLOGY Frequent Urination?: Yes Hard to postpone urination?: No Burning/pain with urination?: No Get up at night to urinate?: Yes Leakage of urine?: No Urine stream starts and stops?: No Trouble starting stream?: No Do you have to strain to urinate?: No Blood in urine?: No Urinary tract infection?: No Sexually transmitted disease?: No Injury to kidneys or bladder?: No Painful intercourse?: No Weak stream?: No Erection problems?: No Penile pain?: No  Gastrointestinal Nausea?: No Vomiting?: No Indigestion/heartburn?: Yes Diarrhea?: No Constipation?: No  Constitutional Fever: No Night sweats?: No Weight loss?: No Fatigue?: No  Skin Skin rash/lesions?: No Itching?: No  Eyes Blurred vision?: No Double vision?: No  Ears/Nose/Throat Sore throat?: No Sinus problems?: Yes  Hematologic/Lymphatic Swollen glands?: No Easy bruising?: No  Cardiovascular Leg swelling?: No Chest pain?: No  Respiratory Cough?: No Shortness of breath?: No  Endocrine Excessive thirst?: No  Musculoskeletal Back pain?: No Joint pain?: No  Neurological Headaches?: No Dizziness?: No  Psychologic Depression?: No Anxiety?: No  Physical Exam: BP 136/79   Pulse 70   Ht 5\' 5"  (1.651 m)   Wt 148 lb (67.1 kg)   BMI 24.63 kg/m   Constitutional:  Alert and oriented, No acute distress. HEENT:  AT, moist mucus membranes.  Trachea midline, no masses. Cardiovascular: No clubbing, cyanosis, or edema. Respiratory: Normal respiratory effort, no increased work of breathing. GI: Abdomen is soft, nontender, nondistended, no abdominal masses GU: No CVA tenderness.  Rectal: Normal sphincter tone.  30 cc prostate, nontender, no nodules Skin: No rashes, bruises or suspicious lesions. Neurologic: Grossly intact, no focal deficits, moving all 4 extremities. Psychiatric: Normal mood and affect.  Laboratory Data: Lab Results  Component Value Date   WBC  6.1 05/17/2016   HGB 15.8 05/17/2016   HCT 46.4 05/17/2016   MCV 97 05/17/2016   PLT 198 05/17/2016    Lab Results  Component Value Date   CREATININE 1.04 11/15/2016    Lab Results  Component Value Date   PSA1 4.2 (H) 11/15/2016   PSA1 4.2 (H) 11/15/2016   PSA1 3.0 05/17/2016    Lab Results  Component Value Date   HGBA1C 5.1 11/15/2016    Urinalysis Lab Results  Component Value Date   SPECGRAV 1.010 05/17/2016   PHUR 7.0 05/17/2016  COLORU yellow 05/17/2016   LEUKOCYTESUR Negative 05/17/2016   PROTEINUR negative 05/17/2016   KETONESU negative 05/17/2016   BILIRUBINUR negative 05/17/2016   NITRITE negative 05/17/2016    Pertinent Imaging: CT scan from 08/2015 reviewed, normal prostate bladder and kidneys.  Assessment & Plan:    1. Elevated PSA  We reviewed the implications of an elevated PSA and the uncertainty surrounding it. In general, a man's PSA increases with age and is produced by both normal and cancerous prostate tissue. Differential for elevated PSA is BPH, prostate cancer, infection, recent intercourse/ejaculation, prostate infarction, recent urethroscopic manipulation (foley placement/cystoscopy) and prostatitis. Management of an elevated PSA can include observation or prostate biopsy and wediscussed this in detail. We discussed that indications for prostate biopsy are defined by age and race specific PSA cutoffs as well as a PSA velocity of 0.75/year.  Review of previous PSA values indicate that his PSA was as high as 3.1 in 2014 which is reassuring that he has not had a precipitous rise.  Rectal exam is unremarkable today.  PSA repeated today.  Will call with results.  If PSA remains stable or lower, will recommend PSA with rectal exam for surveillance in 6 months.  If PSA more elevated, may consider recommendation of prostate biopsy.  He is agreeable to this plan.  2. Urinary frequency Stable baseline urinary frequency Minimal bother No  intervention planned at this time   Return in about 6 months (around 07/06/2017) for PSA/ DRE.  Vanna Scotland, MD  [facility] [address], [address] [place], [place] [address] [phone]

## 2017-01-08 ENCOUNTER — Telehealth: Payer: Self-pay

## 2017-01-08 NOTE — Telephone Encounter (Signed)
-----   Message from Vanna ScotlandAshley Brandon, MD sent at 01/07/2017  9:36 AM EDT ----- PSA now back to 2.81.  No need for biopsy at this point in time.  Recommend follow-up in 6 months with a repeat PSA as discussed.

## 2017-01-08 NOTE — Telephone Encounter (Signed)
Called pt. No answer °

## 2017-01-11 NOTE — Telephone Encounter (Signed)
LMOM for patient to return call.

## 2017-01-11 NOTE — Telephone Encounter (Signed)
Patient called back spoke with him and gave results and follow up info. Patient states he is ok with the plan. Follow up 6 mos.

## 2017-04-16 ENCOUNTER — Other Ambulatory Visit: Payer: Self-pay | Admitting: Internal Medicine

## 2017-04-16 ENCOUNTER — Telehealth: Payer: Self-pay

## 2017-04-16 DIAGNOSIS — I10 Essential (primary) hypertension: Secondary | ICD-10-CM

## 2017-04-16 MED ORDER — OLMESARTAN MEDOXOMIL 20 MG PO TABS: 20.0000 mg | ORAL_TABLET | Freq: Every day | ORAL | 5 refills | Status: DC

## 2017-04-16 NOTE — Telephone Encounter (Signed)
[facility] called [facility] for Irbesartan. I called the pharmacy and they stated the patients medication is not recalled and he just picked up 90 day supply on the 15th. Called the patient back to inform him. He read the letter to me, and stated he is not comfortable with taking this medication and will no longer take it. He stated I was making him feel like "I don't know what i'm talking about".  I informed him I appologize if i'm making him feel this way, and i'm not trying to- just want him to understand this is what the pharmacy told me when I called. I informed him that we just prescribe the medication, and do not despence it so we are unsure of what lot#, or manufacturer he has for his medication to know if its been recalled. I spoke with Dr Judithann GravesBerglund- and informed patient we will send in different medication for him since he is uncomfortable with taking this one.  Please send new medication to [facility] in [place].

## 2017-05-21 ENCOUNTER — Encounter: Payer: Self-pay | Admitting: Internal Medicine

## 2017-05-21 ENCOUNTER — Ambulatory Visit (INDEPENDENT_AMBULATORY_CARE_PROVIDER_SITE_OTHER): Payer: Medicare HMO | Admitting: Internal Medicine

## 2017-05-21 ENCOUNTER — Ambulatory Visit (INDEPENDENT_AMBULATORY_CARE_PROVIDER_SITE_OTHER): Payer: Medicare HMO

## 2017-05-21 VITALS — BP 136/80 | HR 74 | Ht 68.0 in | Wt 157.2 lb

## 2017-05-21 VITALS — BP 136/80 | HR 74 | Temp 98.3°F | Resp 12 | Ht 65.0 in | Wt 157.0 lb

## 2017-05-21 DIAGNOSIS — I714 Abdominal aortic aneurysm, without rupture, unspecified: Secondary | ICD-10-CM

## 2017-05-21 DIAGNOSIS — Z0000 Encounter for general adult medical examination without abnormal findings: Secondary | ICD-10-CM

## 2017-05-21 DIAGNOSIS — E782 Mixed hyperlipidemia: Secondary | ICD-10-CM

## 2017-05-21 DIAGNOSIS — R972 Elevated prostate specific antigen [PSA]: Secondary | ICD-10-CM

## 2017-05-21 DIAGNOSIS — Z0001 Encounter for general adult medical examination with abnormal findings: Secondary | ICD-10-CM

## 2017-05-21 DIAGNOSIS — I10 Essential (primary) hypertension: Secondary | ICD-10-CM | POA: Diagnosis not present

## 2017-05-21 LAB — POCT URINALYSIS DIPSTICK
BILIRUBIN UA: NEGATIVE
Glucose, UA: NEGATIVE
KETONES UA: NEGATIVE
Leukocytes, UA: NEGATIVE
Nitrite, UA: NEGATIVE
PH UA: 5 (ref 5.0–8.0)
Protein, UA: NEGATIVE
SPEC GRAV UA: 1.015 (ref 1.010–1.025)
UROBILINOGEN UA: 0.2 U/dL

## 2017-05-21 MED ORDER — OLMESARTAN MEDOXOMIL 20 MG PO TABS: 20.0000 mg | ORAL_TABLET | Freq: Every day | ORAL | 3 refills | Status: DC

## 2017-05-21 MED ORDER — AMLODIPINE BESYLATE 5 MG PO TABS: 5.0000 mg | ORAL_TABLET | Freq: Every day | ORAL | 3 refills | Status: DC

## 2017-05-21 MED ORDER — HYDROCHLOROTHIAZIDE 25 MG PO TABS: 25.0000 mg | ORAL_TABLET | Freq: Every day | ORAL | 3 refills | Status: DC

## 2017-05-21 MED ORDER — ATORVASTATIN CALCIUM 10 MG PO TABS: 10.0000 mg | ORAL_TABLET | Freq: Every day | ORAL | 3 refills | Status: DC

## 2017-05-21 MED ORDER — CARVEDILOL 6.25 MG PO TABS: 6.2500 mg | ORAL_TABLET | Freq: Two times a day (BID) | ORAL | 3 refills | Status: DC

## 2017-05-21 NOTE — Patient Instructions (Signed)
Mr. Trevor Werner , Thank you for taking time to come for your Medicare Wellness Visit. I appreciate your ongoing commitment to your health goals. Please review the following plan we discussed and let me know if I can assist you in the future.   Screening recommendations/referrals: Colorectal Screening: Completed colonoscopy 08/18/09. Repeat every 10 years.  Vision/Dental Exams: Recommended yearly ophthalmology/optometry visit for glaucoma screening and checkup Recommended yearly dental visit for hygiene and checkup  Vaccinations: Influenza vaccine: Up to date Pneumococcal vaccine: Completed series Tdap vaccine: Up to date Shingles vaccine: Please call your insurance company to determine your out of pocket expense for the Shingrix vaccine. You may also receive this vaccine at your local pharmacy or Health Dept.    Advanced directives: Advance directive discussed with you today. I have provided a copy for you to complete at home and have notarized. Once this is complete please bring a copy in to our office so we can scan it into your chart.  Conditions/risks identified: Recommend to drink at least 6-8 8oz glasses of water per day.  Next appointment: Please schedule your Annual Wellness Visit with your Nurse Health Advisor in one year.  Preventive Care 7365 Years and Older, Male Preventive care refers to lifestyle choices and visits with your health care provider that can promote health and wellness. What does preventive care include?  A yearly physical exam. This is also called an annual well check.  Dental exams once or twice a year.  Routine eye exams. Ask your health care provider how often you should have your eyes checked.  Personal lifestyle choices, including:  Daily care of your teeth and gums.  Regular physical activity.  Eating a healthy diet.  Avoiding tobacco and drug use.  Limiting alcohol use.  Practicing safe sex.  Taking low doses of aspirin every day.  Taking  vitamin and mineral supplements as recommended by your health care provider. What happens during an annual well check? The services and screenings done by your health care provider during your annual well check will depend on your age, overall health, lifestyle risk factors, and family history of disease. Counseling  Your health care provider may ask you questions about your:  Alcohol use.  Tobacco use.  Drug use.  Emotional well-being.  Home and relationship well-being.  Sexual activity.  Eating habits.  History of falls.  Memory and ability to understand (cognition).  Work and work Astronomerenvironment. Screening  You may have the following tests or measurements:  Height, weight, and BMI.  Blood pressure.  Lipid and cholesterol levels. These may be checked every 5 years, or more frequently if you are over 136 years old.  Skin check.  Lung cancer screening. You may have this screening every year starting at age 68 if you have a 30-pack-year history of smoking and currently smoke or have quit within the past 15 years.  Fecal occult blood test (FOBT) of the stool. You may have this test every year starting at age 68.  Flexible sigmoidoscopy or colonoscopy. You may have a sigmoidoscopy every 5 years or a colonoscopy every 10 years starting at age 68.  Prostate cancer screening. Recommendations will vary depending on your family history and other risks.  Hepatitis C blood test.  Hepatitis B blood test.  Sexually transmitted disease (STD) testing.  Diabetes screening. This is done by checking your blood sugar (glucose) after you have not eaten for a while (fasting). You may have this done every 1-3 years.  Abdominal aortic aneurysm (  AAA) screening. You may need this if you are a current or former smoker.  Osteoporosis. You may be screened starting at age 91 if you are at high risk. Talk with your health care provider about your test results, treatment options, and if  necessary, the need for more tests. Vaccines  Your health care provider may recommend certain vaccines, such as:  Influenza vaccine. This is recommended every year.  Tetanus, diphtheria, and acellular pertussis (Tdap, Td) vaccine. You may need a Td booster every 10 years.  Zoster vaccine. You may need this after age 54.  Pneumococcal 13-valent conjugate (PCV13) vaccine. One dose is recommended after age 40.  Pneumococcal polysaccharide (PPSV23) vaccine. One dose is recommended after age 69. Talk to your health care provider about which screenings and vaccines you need and how often you need them. This information is not intended to replace advice given to you by your health care provider. Make sure you discuss any questions you have with your health care provider. Document Released: 04/02/2015 Document Revised: 11/24/2015 Document Reviewed: 01/05/2015 Elsevier Interactive Patient Education  2017 [place] Prevention in the Home Falls can cause injuries. They can happen to people of all ages. There are many things you can do to make your home safe and to help prevent falls. What can I do on the outside of my home?  Regularly fix the edges of walkways and driveways and fix any cracks.  Remove anything that might make you trip as you walk through a door, such as a raised step or threshold.  Trim any bushes or trees on the path to your home.  Use bright outdoor lighting.  Clear any walking paths of anything that might make someone trip, such as rocks or tools.  Regularly check to see if handrails are loose or broken. Make sure that both sides of any steps have handrails.  Any raised decks and porches should have guardrails on the edges.  Have any leaves, snow, or ice cleared regularly.  Use sand or salt on walking paths during winter.  Clean up any spills in your garage right away. This includes oil or grease spills. What can I do in the bathroom?  Use night  lights.  Install grab bars by the toilet and in the tub and shower. Do not use towel bars as grab bars.  Use non-skid mats or decals in the tub or shower.  If you need to sit down in the shower, use a plastic, non-slip stool.  Keep the floor dry. Clean up any water that spills on the floor as soon as it happens.  Remove soap buildup in the tub or shower regularly.  Attach bath mats securely with double-sided non-slip rug tape.  Do not have throw rugs and other things on the floor that can make you trip. What can I do in the bedroom?  Use night lights.  Make sure that you have a light by your bed that is easy to reach.  Do not use any sheets or blankets that are too big for your bed. They should not hang down onto the floor.  Have a firm chair that has side arms. You can use this for support while you get dressed.  Do not have throw rugs and other things on the floor that can make you trip. What can I do in the kitchen?  Clean up any spills right away.  Avoid walking on wet floors.  Keep items that you use a lot in  easy-to-reach places.  If you need to reach something above you, use a strong step stool that has a grab bar.  Keep electrical cords out of the way.  Do not use floor polish or wax that makes floors slippery. If you must use wax, use non-skid floor wax.  Do not have throw rugs and other things on the floor that can make you trip. What can I do with my stairs?  Do not leave any items on the stairs.  Make sure that there are handrails on both sides of the stairs and use them. Fix handrails that are broken or loose. Make sure that handrails are as long as the stairways.  Check any carpeting to make sure that it is firmly attached to the stairs. Fix any carpet that is loose or worn.  Avoid having throw rugs at the top or bottom of the stairs. If you do have throw rugs, attach them to the floor with carpet tape.  Make sure that you have a light switch at the  top of the stairs and the bottom of the stairs. If you do not have them, ask someone to add them for you. What else can I do to help prevent falls?  Wear shoes that:  Do not have high heels.  Have rubber bottoms.  Are comfortable and fit you well.  Are closed at the toe. Do not wear sandals.  If you use a stepladder:  Make sure that it is fully opened. Do not climb a closed stepladder.  Make sure that both sides of the stepladder are locked into place.  Ask someone to hold it for you, if possible.  Clearly mark and make sure that you can see:  Any grab bars or handrails.  First and last steps.  Where the edge of each step is.  Use tools that help you move around (mobility aids) if they are needed. These include:  Canes.  Walkers.  Scooters.  Crutches.  Turn on the lights when you go into a dark area. Replace any light bulbs as soon as they burn out.  Set up your furniture so you have a clear path. Avoid moving your furniture around.  If any of your floors are uneven, fix them.  If there are any pets around you, be aware of where they are.  Review your medicines with your doctor. Some medicines can make you feel dizzy. This can increase your chance of falling. Ask your doctor what other things that you can do to help prevent falls. This information is not intended to replace advice given to you by your health care provider. Make sure you discuss any questions you have with your health care provider. Document Released: 12/31/2008 Document Revised: 08/12/2015 Document Reviewed: 04/10/2014 Elsevier Interactive Patient Education  2017 [employer].

## 2017-05-21 NOTE — Progress Notes (Signed)
Date:  05/21/2017   Name:  Trevor Werner   DOB:  Oct 16, 1950   MRN:  [id number]   Chief Complaint: Annual Exam (Wants Benicar medication changed to 90 days. ) Trevor Werner is a 67 y.o. male who presents today for his Complete Annual Exam. He feels well. He reports exercising regularly walking 2.5 miles 4 times per week. He reports he is sleeping well.   Hypertension  This is a chronic (usually 120/80) problem. The problem is controlled. Pertinent negatives include no chest pain, headaches, palpitations or shortness of breath. Past treatments include beta blockers, calcium channel blockers, angiotensin blockers and diuretics. Hypertensive end-organ damage includes CAD/MI.  Hyperlipidemia  The problem is controlled. Pertinent negatives include no chest pain, myalgias or shortness of breath. Current antihyperlipidemic treatment includes statins.  CAD - followed by Cardiology but not seen in several years.  AAA - followed by V[facility] surgery at [facility].  Last seen 04/2016.  CT at that time: IMPRESSION: -Mildly increased size (7.1 cm, previously 6.9 cm) of the thoracic aspect of the type B thoracoabdominal aortic aneurysm. Unchanged aneurysmal dilatation of the abdominal aorta. 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 He has decided not to have surgery due to high risk. Elevated PSA - last reading was improved, he has no urinary symptoms.   Review of Systems  Constitutional: Negative for appetite change, chills, diaphoresis, fatigue and unexpected weight change.  HENT: Negative for hearing loss, tinnitus, trouble swallowing and voice change.   Eyes: Negative for visual disturbance.  Respiratory: Negative for choking, shortness of breath and wheezing.   Cardiovascular: Negative for chest pain, palpitations and leg swelling.  Gastrointestinal: Negative for abdominal pain, blood in stool, constipation and diarrhea.  Genitourinary: Negative for difficulty urinating, dysuria and  frequency.  Musculoskeletal: Negative for arthralgias, back pain and myalgias.  Skin: Negative for color change and rash.  Neurological: Negative for dizziness, syncope and headaches.  Hematological: Negative for adenopathy.  Psychiatric/Behavioral: Negative for dysphoric mood and sleep disturbance.    Patient Active Problem List   Diagnosis Date Noted  . Rising PSA level 05/18/2016  . CAD (coronary artery disease) 11/01/2015  . Pulmonary nodule, right 05/17/2015  . Edema leg 02/02/2015  . AAA (abdominal aortic aneurysm) without rupture (HCC) 07/28/2014  . Benign essential HTN 07/28/2014  . Reflux esophagitis 03/28/2010  . Hyperlipidemia 01/04/2010  . 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 Authorizing Provider  amLODipine (NORVASC) 5 MG tablet Take 1 tablet (5 mg total) by mouth daily. 05/17/16   Reubin Milan, MD  aspirin EC 81 MG tablet Take 1 tablet by mouth daily.    [provider]  atorvastatin (LIPITOR) 10 MG tablet Take 1 tablet (10 mg total) by mouth at bedtime. 05/17/16   Reubin Milan, MD  carvedilol (COREG) 6.25 MG tablet Take 1 tablet (6.25 mg total) by mouth 2 (two) times daily. 05/17/16   Reubin Milan, MD  hydrochlorothiazide (HYDRODIURIL) 25 MG tablet Take 1 tablet (25 mg total) by mouth daily. 05/17/16   Reubin Milan, MD  olmesartan (BENICAR) 20 MG tablet Take 1 tablet (20 mg total) by mouth daily. 04/16/17   Reubin Milan, MD    No Known Allergies  Past Surgical History:  Procedure Laterality Date  . CATARACT EXTRACTION Bilateral   . COLONOSCOPY  2011  . HAND SURGERY Right 1974  . RHINOPLASTY  Social History   Tobacco Use  . Smoking status: Former Smoker    Last attempt to quit: 03/20/1978    Years since quitting: 39.1  . Smokeless tobacco: Never Used  Substance Use Topics  . Alcohol use: Yes    Alcohol/week: 12.6 oz    Types: 21 Standard drinks or  equivalent per week  . Drug use: No     Medication list has been reviewed and updated.  PHQ 2/9 Scores 05/21/2017 05/17/2016 05/17/2016 05/17/2015  PHQ - 2 Score 0 0 0 0  PHQ- 9 Score 0 - - -    Physical Exam  Constitutional: He is oriented to person, place, and time. He appears well-developed and well-nourished.  HENT:  Head: Normocephalic.  Right Ear: Tympanic membrane, external ear and ear canal normal.  Left Ear: Tympanic membrane, external ear and ear canal normal.  Nose: Nose normal.  Mouth/Throat: Uvula is midline and oropharynx is clear and moist.  Eyes: Conjunctivae and EOM are normal. Pupils are equal, round, and reactive to light.  Neck: Normal range of motion. Neck supple. Carotid bruit is not present. No thyromegaly present.  Cardiovascular: Normal rate, regular rhythm, normal heart sounds and intact distal pulses.  Pulmonary/Chest: Effort normal and breath sounds normal. He has no wheezes. Right breast exhibits no mass. Left breast exhibits no mass.  Abdominal: Soft. Normal appearance and bowel sounds are normal. There is no hepatosplenomegaly. There is no tenderness.  Musculoskeletal: Normal range of motion.  Lymphadenopathy:    He has no cervical adenopathy.  Neurological: He is alert and oriented to person, place, and time. He has normal reflexes.  Skin: Skin is warm, dry and intact.  Psychiatric: He has a normal mood and affect. His speech is normal and behavior is normal. Judgment and thought content normal.  Nursing note and vitals reviewed.   BP 136/80   Pulse 74   Ht 5\' 8"  (1.727 m)   Wt 157 lb 3.2 oz (71.3 kg)   SpO2 99%   BMI 23.90 kg/m   Assessment and Plan: 1. Annual physical exam Normal exam - POCT urinalysis dipstick  2. Benign essential HTN controlled - CBC with Differential/Platelet - Comprehensive metabolic panel - olmesartan (BENICAR) 20 MG tablet; Take 1 tablet (20 mg total) by mouth daily.  Dispense: 90 tablet; Refill: 3 -  hydrochlorothiazide (HYDRODIURIL) 25 MG tablet; Take 1 tablet (25 mg total) by mouth daily.  Dispense: 90 tablet; Refill: 3 - amLODipine (NORVASC) 5 MG tablet; Take 1 tablet (5 mg total) by mouth daily.  Dispense: 90 tablet; Refill: 3 - carvedilol (COREG) 6.25 MG tablet; Take 1 tablet (6.25 mg total) by mouth 2 (two) times daily.  Dispense: 180 tablet; Refill: 3  3. AAA (abdominal aortic aneurysm) without rupture (HCC) Pt has elected not to proceed with surgery Continue tight control of BP, exercise and smoking cessation  4. Mixed hyperlipidemia Continue statin therapy - Lipid panel - atorvastatin (LIPITOR) 10 MG tablet; Take 1 tablet (10 mg total) by mouth at bedtime.  Dispense: 90 tablet; Refill: 3  5. Rising PSA level Recent DRE with Urology was normal - PSA   Meds ordered this encounter  Medications  . atorvastatin (LIPITOR) 10 MG tablet    Sig: Take 1 tablet (10 mg total) by mouth at bedtime.    Dispense:  90 tablet    Refill:  3    Please consider 90 day supplies to promote better adherence  . olmesartan (BENICAR) 20 MG tablet  Sig: Take 1 tablet (20 mg total) by mouth daily.    Dispense:  90 tablet    Refill:  3  . hydrochlorothiazide (HYDRODIURIL) 25 MG tablet    Sig: Take 1 tablet (25 mg total) by mouth daily.    Dispense:  90 tablet    Refill:  3  . amLODipine (NORVASC) 5 MG tablet    Sig: Take 1 tablet (5 mg total) by mouth daily.    Dispense:  90 tablet    Refill:  3  . carvedilol (COREG) 6.25 MG tablet    Sig: Take 1 tablet (6.25 mg total) by mouth 2 (two) times daily.    Dispense:  180 tablet    Refill:  3    Please consider 90 day supplies to promote better adherence    Partially dictated using Animal nutritionistDragon software. Any errors are unintentional.  Bari EdwardLaura Madicyn Mesina, MD [facility]  05/21/2017

## 2017-05-21 NOTE — Progress Notes (Signed)
Subjective:   Trevor Werner is a 67 y.o. male who presents for Medicare Annual/Subsequent preventive examination.  Review of Systems:  N/A Cardiac Risk Factors include: advanced age (>6455men, 64>65 women);dyslipidemia;hypertension;male gender     Objective:    Vitals: BP 136/80 (BP Location: Right Arm, Patient Position: Sitting, Cuff Size: Normal)   Pulse 74   Temp 98.3 F (36.8 C) (Oral)   Resp 12   Ht 5\' 5"  (1.651 m)   Wt 157 lb (71.2 kg)   BMI 26.13 kg/m   Body mass index is 26.13 kg/m.  Advanced Directives 05/21/2017 05/17/2016 07/15/2015 05/17/2015  Does Patient Have a Medical Advance Directive? No No No No  Would patient like information on creating a medical advance directive? Yes (MAU/Ambulatory/Procedural Areas - Information given) - No - patient declined information No - patient declined information    Tobacco Social History   Tobacco Use  Smoking Status Former Smoker  . Packs/day: 2.00  . Years: 15.00  . Pack years: 30.00  . Types: Cigarettes  . Last attempt to quit: 03/20/1978  . Years since quitting: 39.1  Smokeless Tobacco Never Used  Tobacco Comment   smoking cessation materials not required     Counseling given: No Comment: smoking cessation materials not required   Clinical Intake:  Pre-visit preparation completed: Yes  Pain : No/denies pain   BMI - recorded: 26.13 Nutritional Status: BMI 25 -29 Overweight Nutritional Risks: None Diabetes: No  How often do you need to have someone help you when you read instructions, pamphlets, or other written materials from your doctor or pharmacy?: 1 - Never  Interpreter Needed?: No  Information entered by :: AEversole, LPN  Past Medical History:  Diagnosis Date  . Aneurysm of aorta (HCC)   . Atherosclerotic cardiovascular disease   . GERD (gastroesophageal reflux disease)   . Heartburn   . HLD (hyperlipidemia)   . Hypertension   . Localized edema   . Tinnitus    Past Surgical History:  Procedure  Laterality Date  . CATARACT EXTRACTION Bilateral   . COLONOSCOPY  2011  . HAND SURGERY Right 1974  . RHINOPLASTY     Family History  Problem Relation Age of Onset  . Stroke Father   . Heart failure Mother   . Heart failure Brother   . Heart block Sister   . Prostate cancer Neg Hx   . Kidney cancer Neg Hx   . Bladder Cancer Neg Hx    Social History   Socioeconomic History  . Marital status: Divorced    Spouse name: None  . Number of children: 1  . Years of education: some college  . Highest education level: 12th grade  Social Needs  . Financial resource strain: Not hard at all  . Food insecurity - worry: Never true  . Food insecurity - inability: Never true  . Transportation needs - medical: No  . Transportation needs - non-medical: No  Occupational History  . Occupation: Retired  Tobacco Use  . Smoking status: Former Smoker    Packs/day: 2.00    Years: 15.00    Pack years: 30.00    Types: Cigarettes    Last attempt to quit: 03/20/1978    Years since quitting: 39.1  . Smokeless tobacco: Never Used  . Tobacco comment: smoking cessation materials not required  Substance and Sexual Activity  . Alcohol use: Yes    Alcohol/week: 12.0 oz    Types: 6 Cans of beer, 14 Shots of liquor per  week  . Drug use: No  . Sexual activity: Not Currently  Other Topics Concern  . None  Social History Narrative  . None    Outpatient Encounter Medications as of 05/21/2017  Medication Sig  . amLODipine (NORVASC) 5 MG tablet Take 1 tablet (5 mg total) by mouth daily.  Marland Kitchen aspirin EC 81 MG tablet Take 1 tablet by mouth daily.  Marland Kitchen atorvastatin (LIPITOR) 10 MG tablet Take 1 tablet (10 mg total) by mouth at bedtime.  . carvedilol (COREG) 6.25 MG tablet Take 1 tablet (6.25 mg total) by mouth 2 (two) times daily.  . hydrochlorothiazide (HYDRODIURIL) 25 MG tablet Take 1 tablet (25 mg total) by mouth daily.  Marland Kitchen olmesartan (BENICAR) 20 MG tablet Take 1 tablet (20 mg total) by mouth daily.   No  facility-administered encounter medications on file as of 05/21/2017.     Activities of Daily Living In your present state of health, do you have any difficulty performing the following activities: 05/21/2017 05/21/2017  Hearing? Y N  Comment tinnitus, denies hearing aids -  Vision? N N  Comment wears reading glasses -  Difficulty concentrating or making decisions? N N  Walking or climbing stairs? N N  Dressing or bathing? N N  Doing errands, shopping? N N  Preparing Food and eating ? N -  Comment denies dentures -  Using the Toilet? N -  In the past six months, have you accidently leaked urine? N -  Do you have problems with loss of bowel control? N -  Managing your Medications? N -  Managing your Finances? N -  Housekeeping or managing your Housekeeping? N -  Some recent data might be hidden    Patient Care Team: Reubin Milan, MD as PCP - General (Internal Medicine) Lamar Blinks, MD as Consulting Physician (Cardiology) Schnier, Latina Craver, MD (Vascular Surgery) Suszanne Finch, MD as Attending Physician (Vascular Surgery) Vanna Scotland, MD as Consulting Physician (Urology)   Assessment:   This is a routine wellness examination for Trevor Werner.  Exercise Activities and Dietary recommendations Current Exercise Habits: Home exercise routine, Type of exercise: walking, Time (Minutes): 60, Frequency (Times/Week): 4, Weekly Exercise (Minutes/Week): 240, Intensity: Mild, Exercise limited by: None identified  Goals    . DIET - INCREASE WATER INTAKE     Recommend to drink at least 6-8 8oz glasses of water per day.       Fall Risk Fall Risk  05/21/2017 05/21/2017 05/17/2016 05/17/2016 05/17/2015  Falls in the past year? No No No No No  Risk for fall due to : Other (Comment) - - - -  Risk for fall due to: Comment tinnitus - - - -   Is the patient's home free of loose throw rugs in walkways, pet beds, electrical cords, etc?   Yes Does the patient have any grab bars in the bathroom? No   Does the patient use a shower chair when bathing? No Does the patient have any stairs in or around the home? No If so, are there any handrails?  N/A Does the patient have adequate lighting?  Yes Does the patient use a cane, walker or w/c? No Does the patient use of an elevated toilet seat? No  Timed Get Up and Go Performed: Yes. Pt ambulated 10 feet within 5 sec. Gait stead-fast and without the use of an assistive device. No intervention required at this time. Fall risk prevention has been discussed.  Pt declined my offer to send [employer]  Referral to Care Guide for installation of grab bars in the shower, shower chair or an elevated toilet seat.  Depression Screen PHQ 2/9 Scores 05/21/2017 05/21/2017 05/17/2016 05/17/2016  PHQ - 2 Score 0 0 0 0  PHQ- 9 Score 0 0 - -    Cognitive Function     6CIT Screen 05/21/2017 05/17/2016  What Year? 0 points 0 points  What month? 0 points 0 points  What time? 0 points 0 points  Count back from 20 0 points 0 points  Months in reverse 0 points 2 points  Repeat phrase 0 points -  Total Score 0 -    Immunization History  Administered Date(s) Administered  . Influenza Whole 12/07/2009  . Influenza-Unspecified 03/18/2015, 01/21/2016, 12/26/2016  . Pneumococcal Conjugate-13 05/17/2015  . Pneumococcal Polysaccharide-23 05/17/2016  . Td 12/07/2009    Qualifies for Shingles Vaccine? Yes. Due for Zostavax or Shingrix vaccine. Education has been provided regarding the importance of this vaccine. Pt has been advised to call his insurance company to determine his out of pocket expense. Advised he may also receive this vaccine at his local pharmacy or Health Dept. Verbalized acceptance and understanding.  Screening Tests Health Maintenance  Topic Date Due  . COLONOSCOPY  08/19/2019  . TETANUS/TDAP  12/08/2019  . INFLUENZA VACCINE  Completed  . PNA vac Low Risk Adult  Completed  . Hepatitis C Screening  Discontinued   Cancer Screenings: Lung:  Low Dose CT Chest recommended if Age 39-80 years, 30 pack-year currently smoking OR have quit w/in 15years. Patient does qualify. Pt declined to be referred to Glenna Fellows, RN for lung cancer screening and scheduling of CT. Education has been provided regarding the importance of this screening but still declined. States he would not undergo treatment for any masses that were found.Colorectal: Completed colonoscopy 08/18/09. Repeat every 10 years.  Additional Screenings: Hepatitis B/HIV/Syphillis: Does qualify for this screening but declined. Education has been provided regarding the importance of this screening but still declined. Hepatitis C Screening: Does qualify for this screening but declined. Education has been provided regarding the importance of this screening but still declined.    Plan:  I have personally reviewed and addressed the Medicare Annual Wellness questionnaire and have noted the following in the patient's chart:  A. Medical and social history B. Use of alcohol, tobacco or illicit drugs  C. Current medications and supplements D. Functional ability and status E.  Nutritional status F.  Physical activity G. Advance directives H. List of other physicians I.  Hospitalizations, surgeries, and ER visits in previous 12 months J.  Vitals K. Screenings such as hearing and vision if needed, cognitive and depression L. Referrals and appointments - none  In addition, I have reviewed and discussed with patient certain preventive protocols, quality metrics, and best practice recommendations. A written personalized care plan for preventive services as well as general preventive health recommendations were provided to patient.  Signed,  Deon Pilling, LPN Nurse Health Advisor  MD Recommendations: Due for Zostavax or Shingrix vaccine. Education has been provided regarding the importance of this vaccine. Pt has been advised to call his insurance company to determine his out of pocket  expense. Advised he may also receive this vaccine at his local pharmacy or Health Dept. Verbalized acceptance and understanding.  Due for Hepatitis B/HIV/Syphillis screening. Does qualify for this screening but declined. Education has been provided regarding the importance of this screening but still declined.  Due for Hepatitis C Screening. Does qualify for this  screening but declined. Education has been provided regarding the importance of this screening but still declined.  Lung Cancer Screening: Patient does qualify, 2pks/day for 15 years, 30pk per year smoking history. Pt declined to be referred to Glenna Fellows, RN for lung cancer screening and scheduling of CT. Education has been provided regarding the importance of this screening but still declined. States he would not undergo treatment for any masses that were found.

## 2017-05-22 LAB — CBC WITH DIFFERENTIAL/PLATELET
BASOS ABS: 0 10*3/uL (ref 0.0–0.2)
BASOS: 1 %
EOS (ABSOLUTE): 0.2 10*3/uL (ref 0.0–0.4)
Eos: 3 %
HEMOGLOBIN: 15.4 g/dL (ref 13.0–17.7)
Hematocrit: 44.7 % (ref 37.5–51.0)
IMMATURE GRANS (ABS): 0 10*3/uL (ref 0.0–0.1)
Immature Granulocytes: 0 %
LYMPHS ABS: 1.7 10*3/uL (ref 0.7–3.1)
LYMPHS: 31 %
MCH: 33.2 pg — AB (ref 26.6–33.0)
MCHC: 34.5 g/dL (ref 31.5–35.7)
MCV: 96 fL (ref 79–97)
Monocytes Absolute: 0.7 10*3/uL (ref 0.1–0.9)
Monocytes: 12 %
NEUTROS ABS: 2.9 10*3/uL (ref 1.4–7.0)
Neutrophils: 53 %
PLATELETS: 176 10*3/uL (ref 150–379)
RBC: 4.64 x10E6/uL (ref 4.14–5.80)
RDW: 13.6 % (ref 12.3–15.4)
WBC: 5.4 10*3/uL (ref 3.4–10.8)

## 2017-05-22 LAB — PSA: PROSTATE SPECIFIC AG, SERUM: 3.2 ng/mL (ref 0.0–4.0)

## 2017-05-22 LAB — COMPREHENSIVE METABOLIC PANEL
A/G RATIO: 1.6 (ref 1.2–2.2)
ALBUMIN: 4.7 g/dL (ref 3.6–4.8)
ALK PHOS: 68 IU/L (ref 39–117)
ALT: 14 IU/L (ref 0–44)
AST: 20 IU/L (ref 0–40)
BILIRUBIN TOTAL: 1.4 mg/dL — AB (ref 0.0–1.2)
BUN / CREAT RATIO: 15 (ref 10–24)
BUN: 16 mg/dL (ref 8–27)
CHLORIDE: 99 mmol/L (ref 96–106)
CO2: 24 mmol/L (ref 20–29)
Calcium: 9.7 mg/dL (ref 8.6–10.2)
Creatinine, Ser: 1.06 mg/dL (ref 0.76–1.27)
GFR calc non Af Amer: 72 mL/min/{1.73_m2} (ref >59)
GFR, EST AFRICAN AMERICAN: 84 mL/min/{1.73_m2} (ref >59)
Globulin, Total: 3 g/dL (ref 1.5–4.5)
Glucose: 93 mg/dL (ref 65–99)
POTASSIUM: 4.1 mmol/L (ref 3.5–5.2)
SODIUM: 140 mmol/L (ref 134–144)
TOTAL PROTEIN: 7.7 g/dL (ref 6.0–8.5)

## 2017-05-22 LAB — LIPID PANEL
CHOLESTEROL TOTAL: 183 mg/dL (ref 100–199)
Chol/HDL Ratio: 3.3 ratio (ref 0.0–5.0)
HDL: 56 mg/dL (ref >39)
LDL Calculated: 98 mg/dL (ref 0–99)
Triglycerides: 143 mg/dL (ref 0–149)
VLDL Cholesterol Cal: 29 mg/dL (ref 5–40)

## 2017-06-13 ENCOUNTER — Other Ambulatory Visit: Payer: Self-pay | Admitting: Internal Medicine

## 2017-06-13 DIAGNOSIS — I10 Essential (primary) hypertension: Secondary | ICD-10-CM

## 2017-06-13 MED ORDER — LOSARTAN POTASSIUM 100 MG PO TABS: 100.0000 mg | ORAL_TABLET | Freq: Every day | ORAL | 3 refills | Status: DC

## 2017-07-06 ENCOUNTER — Ambulatory Visit: Payer: Medicare HMO | Admitting: Urology

## 2017-07-20 ENCOUNTER — Ambulatory Visit: Payer: Medicare HMO | Admitting: Urology

## 2018-05-22 ENCOUNTER — Encounter: Payer: Self-pay | Admitting: Internal Medicine

## 2018-05-22 ENCOUNTER — Ambulatory Visit: Payer: Medicare HMO

## 2018-05-22 ENCOUNTER — Other Ambulatory Visit: Payer: Self-pay

## 2018-05-22 ENCOUNTER — Ambulatory Visit (INDEPENDENT_AMBULATORY_CARE_PROVIDER_SITE_OTHER): Payer: Medicare HMO | Admitting: Internal Medicine

## 2018-05-22 VITALS — BP 126/78 | HR 62 | Ht 68.0 in | Wt 165.0 lb

## 2018-05-22 DIAGNOSIS — I10 Essential (primary) hypertension: Secondary | ICD-10-CM | POA: Diagnosis not present

## 2018-05-22 DIAGNOSIS — E782 Mixed hyperlipidemia: Secondary | ICD-10-CM

## 2018-05-22 DIAGNOSIS — Z0000 Encounter for general adult medical examination without abnormal findings: Secondary | ICD-10-CM | POA: Diagnosis not present

## 2018-05-22 DIAGNOSIS — R972 Elevated prostate specific antigen [PSA]: Secondary | ICD-10-CM | POA: Diagnosis not present

## 2018-05-22 DIAGNOSIS — I714 Abdominal aortic aneurysm, without rupture, unspecified: Secondary | ICD-10-CM

## 2018-05-22 LAB — POCT URINALYSIS DIPSTICK
Bilirubin, UA: NEGATIVE
Glucose, UA: NEGATIVE
Ketones, UA: NEGATIVE
Leukocytes, UA: NEGATIVE
Nitrite, UA: NEGATIVE
Protein, UA: NEGATIVE
SPEC GRAV UA: 1.02 (ref 1.010–1.025)
Urobilinogen, UA: 0.2 E.U./dL
pH, UA: 7 (ref 5.0–8.0)

## 2018-05-22 MED ORDER — CARVEDILOL 6.25 MG PO TABS: 6.2500 mg | ORAL_TABLET | Freq: Two times a day (BID) | ORAL | 3 refills | Status: DC

## 2018-05-22 MED ORDER — LOSARTAN POTASSIUM 100 MG PO TABS: 100.0000 mg | ORAL_TABLET | Freq: Every day | ORAL | 3 refills | Status: DC

## 2018-05-22 MED ORDER — ATORVASTATIN CALCIUM 10 MG PO TABS: 10.0000 mg | ORAL_TABLET | Freq: Every day | ORAL | 3 refills | Status: DC

## 2018-05-22 MED ORDER — AMLODIPINE BESYLATE 5 MG PO TABS: 5.0000 mg | ORAL_TABLET | Freq: Every day | ORAL | 3 refills | Status: DC

## 2018-05-22 MED ORDER — HYDROCHLOROTHIAZIDE 25 MG PO TABS: 25.0000 mg | ORAL_TABLET | Freq: Every day | ORAL | 3 refills | Status: DC

## 2018-05-22 NOTE — Progress Notes (Signed)
Date:  05/22/2018   Name:  Trevor Werner   DOB:  December 24, 1950   MRN:  [id number]   Chief Complaint: Annual Exam Trevor Werner is a 68 y.o. male who presents today for his Complete Annual Exam. He feels well. He reports exercising walking. He reports he is sleeping fairly well. Colonoscopy was done in 2011 and will be due next year.  He is up to date on all immunizations.  Hypertension  This is a chronic problem. The problem is unchanged. Pertinent negatives include no chest pain, headaches, palpitations or shortness of breath. Past treatments include calcium channel blockers, diuretics, beta blockers and angiotensin blockers. The current treatment provides significant improvement.  Hyperlipidemia  This is a chronic problem. The problem is controlled. Pertinent negatives include no chest pain, myalgias or shortness of breath. Current antihyperlipidemic treatment includes statins. The current treatment provides significant improvement of lipids.  TAA - he denies chest pain, back pain, dizziness, palpitations or other sx.  He is not getting further imaging since his consultation with vascular surgery at [facility].  He felt the risks of surgery were too great and opted to continue risk reduction.    Review of Systems  Constitutional: Negative for appetite change, chills, diaphoresis, fatigue and unexpected weight change.  HENT: Negative for hearing loss, tinnitus, trouble swallowing and voice change.   Eyes: Negative for visual disturbance.  Respiratory: Negative for choking, shortness of breath and wheezing.   Cardiovascular: Negative for chest pain, palpitations and leg swelling.  Gastrointestinal: Negative for abdominal pain, blood in stool, constipation and diarrhea.  Endocrine: Negative for polydipsia and polyuria.  Genitourinary: Negative for difficulty urinating, dysuria and frequency.  Musculoskeletal: Negative for arthralgias, back pain and myalgias.  Skin: Negative for color change and rash.    Neurological: Negative for dizziness, syncope and headaches.  Hematological: Negative for adenopathy.  Psychiatric/Behavioral: Negative for dysphoric mood and sleep disturbance.    Patient Active Problem List   Diagnosis Date Noted  . Rising PSA level 05/18/2016  . CAD (coronary artery disease) 11/01/2015  . Pulmonary nodule, right 05/17/2015  . Edema leg 02/02/2015  . AAA (abdominal aortic aneurysm) without rupture (HCC) 07/28/2014  . Benign essential HTN 07/28/2014  . Reflux esophagitis 03/28/2010  . Hyperlipidemia 01/04/2010  . 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 CATARACT EXTRACTION Bilateral   . COLONOSCOPY  2011  . HAND SURGERY Right 1974  . RHINOPLASTY      Social History   Tobacco Use  . Smoking status: Former Smoker    Packs/day: 2.00    Years: 15.00    Pack years: 30.00    Types: Cigarettes    Last attempt to quit: 03/20/1978    Years since quitting: 40.2  . Smokeless tobacco: Never Used  . Tobacco comment: smoking cessation materials not required  Substance Use Topics  . Alcohol use: Yes    Alcohol/week: 20.0 standard drinks    Types: 6 Cans of beer, 14 Shots of liquor per week  . Drug use: No     Medication list has been reviewed and updated.  Current Meds  Medication Sig  . amLODipine (NORVASC) 5 MG tablet Take 1 tablet (5 mg total) by mouth daily.  Marland Kitchen aspirin EC 81 MG tablet Take 1 tablet by mouth daily.  Marland Kitchen atorvastatin (LIPITOR) 10 MG tablet Take 1 tablet (10 mg total) by mouth at bedtime.  . carvedilol (COREG) 6.25 MG tablet  Take 1 tablet (6.25 mg total) by mouth 2 (two) times daily.  . hydrochlorothiazide (HYDRODIURIL) 25 MG tablet Take 1 tablet (25 mg total) by mouth daily.  Marland Kitchen losartan (COZAAR) 100 MG tablet Take 1 tablet (100 mg total) by mouth daily.    PHQ 2/9 Scores 05/22/2018 05/21/2017 05/21/2017 05/17/2016  PHQ - 2 Score 0 0 0 0  PHQ- 9 Score - 0 0 -   Wt Readings from  Last 3 Encounters:  05/22/18 165 lb (74.8 kg)  05/21/17 157 lb (71.2 kg)  05/21/17 157 lb 3.2 oz (71.3 kg)    Physical Exam Vitals signs and nursing note reviewed.  Constitutional:      Appearance: Normal appearance. He is well-developed.  HENT:     Head: Normocephalic.     Right Ear: Tympanic membrane, ear canal and external ear normal.     Left Ear: Tympanic membrane, ear canal and external ear normal.     Nose: Nose normal.     Mouth/Throat:     Mouth: Mucous membranes are moist.     Pharynx: Uvula midline.  Eyes:     Conjunctiva/sclera: Conjunctivae normal.     Pupils: Pupils are equal, round, and reactive to light.  Neck:     Musculoskeletal: Normal range of motion and neck supple.     Thyroid: No thyromegaly.     Vascular: No carotid bruit.  Cardiovascular:     Rate and Rhythm: Normal rate and regular rhythm.     Heart sounds: Normal heart sounds.  Pulmonary:     Effort: Pulmonary effort is normal.     Breath sounds: Normal breath sounds. No wheezing.  Chest:     Breasts:        Right: No mass.        Left: No mass.  Abdominal:     General: Bowel sounds are normal.     Palpations: Abdomen is soft. There is no mass or pulsatile mass.     Tenderness: There is no abdominal tenderness. There is no guarding or rebound.  Musculoskeletal: Normal range of motion.        General: No swelling, tenderness or deformity.     Right lower leg: No edema.     Left lower leg: No edema.  Lymphadenopathy:     Cervical: No cervical adenopathy.  Skin:    General: Skin is warm and dry.  Neurological:     Mental Status: He is alert and oriented to person, place, and time.     Deep Tendon Reflexes: Reflexes are normal and symmetric.  Psychiatric:        Attention and Perception: Attention normal.        Mood and Affect: Mood normal.        Speech: Speech normal.        Behavior: Behavior normal.        Thought Content: Thought content normal.        Judgment: Judgment normal.      BP 126/78   Pulse 62   Ht 5\' 8"  (1.727 m)   Wt 165 lb (74.8 kg)   SpO2 98%   BMI 25.09 kg/m   Assessment and Plan: 1. Annual physical exam Normal exam Continue exercise and health diet - POCT urinalysis dipstick  2. Benign essential HTN controlled - amLODipine (NORVASC) 5 MG tablet; Take 1 tablet (5 mg total) by mouth daily.  Dispense: 90 tablet; Refill: 3 - carvedilol (COREG) 6.25 MG tablet; Take 1 tablet (6.25  mg total) by mouth 2 (two) times daily.  Dispense: 180 tablet; Refill: 3 - hydrochlorothiazide (HYDRODIURIL) 25 MG tablet; Take 1 tablet (25 mg total) by mouth daily.  Dispense: 90 tablet; Refill: 3 - losartan (COZAAR) 100 MG tablet; Take 1 tablet (100 mg total) by mouth daily.  Dispense: 90 tablet; Refill: 3 - CBC with Differential/Platelet - Comprehensive metabolic panel  3. AAA (abdominal aortic aneurysm) without rupture (HCC) Pt has elected medical management only after consultation with [facility] - the risk of complications are too high for him at this time therefore will not do further imaging  4. Mixed hyperlipidemia On statin therapy - atorvastatin (LIPITOR) 10 MG tablet; Take 1 tablet (10 mg total) by mouth at bedtime.  Dispense: 90 tablet; Refill: 3 - Lipid panel  5. Rising PSA level Check PSA and refer is increasing - PSA, total and free   Partially dictated using Dragon software. Any errors are unintentional.  Bari Edward, MD [facility]  05/22/2018

## 2018-05-23 ENCOUNTER — Ambulatory Visit: Payer: Self-pay

## 2018-05-23 LAB — CBC WITH DIFFERENTIAL/PLATELET
Basophils Absolute: 0 10*3/uL (ref 0.0–0.2)
Basos: 1 %
EOS (ABSOLUTE): 0.1 10*3/uL (ref 0.0–0.4)
EOS: 2 %
HEMATOCRIT: 43.6 % (ref 37.5–51.0)
HEMOGLOBIN: 15.9 g/dL (ref 13.0–17.7)
IMMATURE GRANS (ABS): 0 10*3/uL (ref 0.0–0.1)
IMMATURE GRANULOCYTES: 1 %
Lymphocytes Absolute: 2.1 10*3/uL (ref 0.7–3.1)
Lymphs: 34 %
MCH: 34.7 pg — ABNORMAL HIGH (ref 26.6–33.0)
MCHC: 36.5 g/dL — ABNORMAL HIGH (ref 31.5–35.7)
MCV: 95 fL (ref 79–97)
Monocytes Absolute: 0.8 10*3/uL (ref 0.1–0.9)
Monocytes: 14 %
NEUTROS ABS: 3 10*3/uL (ref 1.4–7.0)
Neutrophils: 48 %
Platelets: 192 10*3/uL (ref 150–450)
RBC: 4.58 x10E6/uL (ref 4.14–5.80)
RDW: 13 % (ref 11.6–15.4)
WBC: 6.1 10*3/uL (ref 3.4–10.8)

## 2018-05-23 LAB — PSA, TOTAL AND FREE
PSA FREE PCT: 22.3 %
PSA, Free: 0.78 ng/mL
Prostate Specific Ag, Serum: 3.5 ng/mL (ref 0.0–4.0)

## 2018-05-23 LAB — COMPREHENSIVE METABOLIC PANEL
ALBUMIN: 4.8 g/dL (ref 3.8–4.8)
ALT: 27 IU/L (ref 0–44)
AST: 26 IU/L (ref 0–40)
Albumin/Globulin Ratio: 1.6 (ref 1.2–2.2)
Alkaline Phosphatase: 104 IU/L (ref 39–117)
BUN/Creatinine Ratio: 14 (ref 10–24)
BUN: 16 mg/dL (ref 8–27)
Bilirubin Total: 1.3 mg/dL — ABNORMAL HIGH (ref 0.0–1.2)
CALCIUM: 10.1 mg/dL (ref 8.6–10.2)
CO2: 25 mmol/L (ref 20–29)
CREATININE: 1.14 mg/dL (ref 0.76–1.27)
Chloride: 99 mmol/L (ref 96–106)
GFR calc Af Amer: 76 mL/min/{1.73_m2} (ref >59)
GFR, EST NON AFRICAN AMERICAN: 66 mL/min/{1.73_m2} (ref >59)
GLOBULIN, TOTAL: 3 g/dL (ref 1.5–4.5)
Glucose: 114 mg/dL — ABNORMAL HIGH (ref 65–99)
Potassium: 4.5 mmol/L (ref 3.5–5.2)
SODIUM: 138 mmol/L (ref 134–144)
Total Protein: 7.8 g/dL (ref 6.0–8.5)

## 2018-05-23 LAB — LIPID PANEL
Chol/HDL Ratio: 3.9 ratio (ref 0.0–5.0)
Cholesterol, Total: 197 mg/dL (ref 100–199)
HDL: 51 mg/dL (ref >39)
LDL Calculated: 108 mg/dL — ABNORMAL HIGH (ref 0–99)
Triglycerides: 192 mg/dL — ABNORMAL HIGH (ref 0–149)
VLDL Cholesterol Cal: 38 mg/dL (ref 5–40)

## 2018-05-27 ENCOUNTER — Ambulatory Visit: Payer: Self-pay

## 2018-06-12 ENCOUNTER — Other Ambulatory Visit: Payer: Self-pay | Admitting: Internal Medicine

## 2018-06-12 ENCOUNTER — Telehealth: Payer: Self-pay

## 2018-06-12 DIAGNOSIS — I10 Essential (primary) hypertension: Secondary | ICD-10-CM

## 2018-06-12 NOTE — Telephone Encounter (Signed)
Patient had declined previous AWV's in office. Notified patient we have capability to complete AWV telephonically during COVID-19 pandemic and he declined again. Advised patient regarding benefits of AWV and encouraged him to contact the office should he desire a wellness visit in the future.

## 2018-06-14 ENCOUNTER — Encounter: Payer: Self-pay | Admitting: Internal Medicine

## 2018-06-14 DIAGNOSIS — R7301 Impaired fasting glucose: Secondary | ICD-10-CM | POA: Insufficient documentation

## 2018-06-14 LAB — HGB A1C W/O EAG: Hgb A1c MFr Bld: 5.3 % (ref 4.8–5.6)

## 2018-06-14 LAB — SPECIMEN STATUS REPORT

## 2019-01-10 ENCOUNTER — Other Ambulatory Visit: Payer: Self-pay

## 2019-01-10 ENCOUNTER — Emergency Department: Payer: Medicare HMO

## 2019-01-10 ENCOUNTER — Encounter: Payer: Self-pay | Admitting: Intensive Care

## 2019-01-10 ENCOUNTER — Telehealth: Payer: Self-pay

## 2019-01-10 ENCOUNTER — Emergency Department
Admission: EM | Admit: 2019-01-10 | Discharge: 2019-01-10 | Disposition: A | Discharge status: Home / Self Care | Payer: Medicare HMO | Attending: Emergency Medicine | Admitting: Emergency Medicine

## 2019-01-10 DIAGNOSIS — Z87891 Personal history of nicotine dependence: Secondary | ICD-10-CM | POA: Insufficient documentation

## 2019-01-10 DIAGNOSIS — R05 Cough: Secondary | ICD-10-CM | POA: Diagnosis not present

## 2019-01-10 DIAGNOSIS — I2510 Atherosclerotic heart disease of native coronary artery without angina pectoris: Secondary | ICD-10-CM | POA: Insufficient documentation

## 2019-01-10 DIAGNOSIS — J209 Acute bronchitis, unspecified: Secondary | ICD-10-CM | POA: Insufficient documentation

## 2019-01-10 DIAGNOSIS — Z7982 Long term (current) use of aspirin: Secondary | ICD-10-CM | POA: Diagnosis not present

## 2019-01-10 DIAGNOSIS — I10 Essential (primary) hypertension: Secondary | ICD-10-CM | POA: Diagnosis not present

## 2019-01-10 DIAGNOSIS — Z79899 Other long term (current) drug therapy: Secondary | ICD-10-CM | POA: Diagnosis not present

## 2019-01-10 DIAGNOSIS — I714 Abdominal aortic aneurysm, without rupture: Secondary | ICD-10-CM | POA: Insufficient documentation

## 2019-01-10 DIAGNOSIS — Z20828 Contact with and (suspected) exposure to other viral communicable diseases: Secondary | ICD-10-CM | POA: Insufficient documentation

## 2019-01-10 DIAGNOSIS — R0602 Shortness of breath: Secondary | ICD-10-CM | POA: Diagnosis not present

## 2019-01-10 LAB — BASIC METABOLIC PANEL
Anion gap: 12 (ref 5–15)
BUN: 14 mg/dL (ref 8–23)
CO2: 21 mmol/L — ABNORMAL LOW (ref 22–32)
Calcium: 9.6 mg/dL (ref 8.9–10.3)
Chloride: 101 mmol/L (ref 98–111)
Creatinine, Ser: 0.97 mg/dL (ref 0.61–1.24)
GFR calc Af Amer: >60 mL/min (ref >60)
GFR calc non Af Amer: >60 mL/min (ref >60)
Glucose, Bld: 135 mg/dL — ABNORMAL HIGH (ref 70–99)
Potassium: 3.5 mmol/L (ref 3.5–5.1)
Sodium: 134 mmol/L — ABNORMAL LOW (ref 135–145)

## 2019-01-10 LAB — CBC
HCT: 43 % (ref 39.0–52.0)
Hemoglobin: 15.4 g/dL (ref 13.0–17.0)
MCH: 33 pg (ref 26.0–34.0)
MCHC: 35.8 g/dL (ref 30.0–36.0)
MCV: 92.3 fL (ref 80.0–100.0)
Platelets: 203 10*3/uL (ref 150–400)
RBC: 4.66 MIL/uL (ref 4.22–5.81)
RDW: 11.6 % (ref 11.5–15.5)
WBC: 6.4 10*3/uL (ref 4.0–10.5)
nRBC: 0 % (ref 0.0–0.2)

## 2019-01-10 LAB — TROPONIN I (HIGH SENSITIVITY): Troponin I (High Sensitivity): 4 ng/L (ref <18)

## 2019-01-10 MED ORDER — IPRATROPIUM-ALBUTEROL 0.5-2.5 (3) MG/3ML IN SOLN
3.0000 mL | Freq: Once | RESPIRATORY_TRACT | Status: AC
  Administered 2019-01-10: 3 mL via RESPIRATORY_TRACT
  Filled 2019-01-10: qty 3

## 2019-01-10 MED ORDER — PREDNISONE 20 MG PO TABS
60.0000 mg | ORAL_TABLET | Freq: Once | ORAL | Status: AC
  Administered 2019-01-10: 12:00:00 60 mg via ORAL
  Filled 2019-01-10: qty 3

## 2019-01-10 MED ORDER — HYDROCODONE-HOMATROPINE 5-1.5 MG/5ML PO SYRP: 5.0000 mL | ORAL_SOLUTION | Freq: Four times a day (QID) | ORAL | 0 refills | Status: DC | PRN

## 2019-01-10 MED ORDER — DOXYCYCLINE HYCLATE 100 MG PO CAPS: 100.0000 mg | ORAL_CAPSULE | Freq: Two times a day (BID) | ORAL | 0 refills | Status: AC

## 2019-01-10 MED ORDER — ALBUTEROL SULFATE HFA 108 (90 BASE) MCG/ACT IN AERS: 2.0000 | INHALATION_SPRAY | Freq: Four times a day (QID) | RESPIRATORY_TRACT | 0 refills | Status: DC | PRN

## 2019-01-10 MED ORDER — PREDNISONE 20 MG PO TABS: 40.0000 mg | ORAL_TABLET | Freq: Every day | ORAL | 0 refills | Status: AC

## 2019-01-10 NOTE — Telephone Encounter (Signed)
Patient called stating he has been having an extremely hard time breathing. Getting worse in the last 2 weeks. Some congestion but the breathing is concerning. Asked what to do.   Called patient back after getting VM and told him to go straight to the ER for evaluation. Told him we need to rule out him having a blood clot, or any heart issues so ER will be able to do these things urgently. Told him if a Covid test is needed, they will do this there as well.   He verbalized understanding and said he will go to [facility] right now.

## 2019-01-10 NOTE — ED Provider Notes (Signed)
[facility] Emergency Department Provider Note  ____________________________________________   First MD Initiated Contact with Patient 01/10/19 1106     (approximate)  I have reviewed the triage vital signs and the nursing notes.   HISTORY  Chief Complaint Shortness of Breath    HPI Trevor Werner is a 68 y.o. male with past medical history of recurrent bronchitis here with cough and shortness of breath.  Patient states that for the last 2 weeks or so, he has had a persistent, dry, hacking cough.  He has had intermittent shortness of breath with exertion.  He tried over-the-counter Mucinex and other mucolytic's without significant relief.  He feels like he has significant congestion in his chest that he is unable to get out.  He said associated occasional chills, but no known or documented fevers.  Reports a history of recurrent bronchitis with similar symptoms in the past.  Denies any nausea or vomiting.  No documented fevers.  He does state that he has had to use an inhaler in the past, but has not had one for his current symptoms.  No other acute complaints.  No recent travel.  No known coronavirus exposures.        Past Medical History:  Diagnosis Date   Aneurysm of aorta (HCC)    Atherosclerotic cardiovascular disease    GERD (gastroesophageal reflux disease)    Heartburn    HLD (hyperlipidemia)    Hypertension    Localized edema    Tinnitus     Patient Active Problem List   Diagnosis Date Noted   Fasting hyperglycemia 06/14/2018   Rising PSA level 05/18/2016   CAD (coronary artery disease) 11/01/2015   Pulmonary nodule, right 05/17/2015   Edema leg 02/02/2015   AAA (abdominal aortic aneurysm) without rupture (HCC) 07/28/2014   Benign essential HTN 07/28/2014   Reflux esophagitis 03/28/2010   Hyperlipidemia 01/04/2010   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 Authorizing Provider  amLODipine (NORVASC) 5 MG tablet Take 1 tablet (5 mg total) by mouth daily. 05/22/18   Reubin Milan, MD  aspirin EC 81 MG tablet Take 1 tablet by mouth daily.    [provider]  atorvastatin (LIPITOR) 10 MG tablet Take 1 tablet (10 mg total) by mouth at bedtime. 05/22/18   Reubin Milan, MD  carvedilol (COREG) 6.25 MG tablet Take 1 tablet (6.25 mg total) by mouth 2 (two) times daily. 05/22/18   Reubin Milan, MD  doxycycline (VIBRAMYCIN) 100 MG capsule Take 1 capsule (100 mg total) by mouth 2 (two) times daily for 7 days. 01/10/19 01/17/19  Shaune Pollack, MD  hydrochlorothiazide (HYDRODIURIL) 25 MG tablet Take 1 tablet by mouth once daily 06/12/18   Reubin Milan, MD  HYDROcodone-homatropine [facility]) 5-1.5 MG/5ML syrup Take 5 mLs by mouth every 6 (six) hours as needed for cough. 01/10/19   Shaune Pollack, MD  losartan (COZAAR) 100 MG tablet Take 1 tablet (100 mg total) by mouth daily. 05/22/18   Reubin Milan, MD  predniSONE (DELTASONE) 20 MG tablet Take 2 tablets (40 mg total) by mouth daily for 5 days. 01/10/19 01/15/19  Shaune Pollack, MD    Allergies Patient has no known allergies.  Family History  Problem  Relation Age of Onset   Stroke Father    Heart failure Mother    Heart failure Brother    Heart block Sister    Prostate cancer Neg Hx    Kidney cancer Neg Hx    Bladder Cancer Neg Hx     Social History Social History   Tobacco Use   Smoking status: Former Smoker    Packs/day: 2.00    Years: 15.00    Pack years: 30.00    Types: Cigarettes    Quit date: 03/20/1978    Years since quitting: 40.8   Smokeless tobacco: Never Used   Tobacco comment: smoking cessation materials not required  Substance Use Topics   Alcohol use: Yes    Alcohol/week:  22.0 standard drinks    Types: 10 Shots of liquor, 12 Cans of beer per week   Drug use: No    Review of Systems  Review of Systems  Constitutional: Positive for chills and fatigue. Negative for fever.  HENT: Negative for sore throat.   Respiratory: Positive for cough, shortness of breath and wheezing.   Cardiovascular: Negative for chest pain.  Gastrointestinal: Negative for abdominal pain.  Genitourinary: Negative for flank pain.  Musculoskeletal: Negative for neck pain.  Skin: Negative for rash and wound.  Allergic/Immunologic: Negative for immunocompromised state.  Neurological: Negative for weakness and numbness.  Hematological: Does not bruise/bleed easily.  All other systems reviewed and are negative.    ____________________________________________  PHYSICAL EXAM:      VITAL SIGNS: ED Triage Vitals  Enc Vitals Group     BP 01/10/19 1024 139/87     Pulse Rate 01/10/19 1024 71     Resp 01/10/19 1024 20     Temp 01/10/19 1024 98.8 F (37.1 C)     Temp Source 01/10/19 1024 Oral     SpO2 01/10/19 1024 98 %     Weight 01/10/19 1024 160 lb (72.6 kg)     Height 01/10/19 1024 5\' 6"  (1.676 m)     Head Circumference --      Peak Flow --      Pain Score 01/10/19 1028 0     Pain Loc --      Pain Edu? --      Excl. in [place]? --      Physical Exam Vitals signs and nursing note reviewed.  Constitutional:      General: He is not in acute distress.    Appearance: He is well-developed. He is not ill-appearing.  HENT:     Head: Normocephalic and atraumatic.  Eyes:     Conjunctiva/sclera: Conjunctivae normal.  Neck:     Musculoskeletal: Neck supple.  Cardiovascular:     Rate and Rhythm: Normal rate and regular rhythm.     Heart sounds: Normal heart sounds. No murmur. No friction rub.  Pulmonary:     Effort: Pulmonary effort is normal. No respiratory distress.     Breath sounds: Examination of the right-lower field reveals wheezing. Examination of the left-lower field  reveals wheezing. Wheezing present. No rales.  Abdominal:     General: There is no distension.     Palpations: Abdomen is soft.     Tenderness: There is no abdominal tenderness.  Skin:    General: Skin is warm.     Capillary Refill: Capillary refill takes less than 2 seconds.  Neurological:     Mental Status: He is alert and oriented to person, place, and time.     Motor: No  abnormal muscle tone.       ____________________________________________   LABS (all labs ordered are listed, but only abnormal results are displayed)  Labs Reviewed  BASIC METABOLIC PANEL - Abnormal; Notable for the following components:      Result Value   Sodium 134 (*)    CO2 21 (*)    Glucose, Bld 135 (*)    All other components within normal limits  NOVEL CORONAVIRUS, NAA (HOSP ORDER, SEND-OUT TO REF LAB; TAT 18-24 HRS)  CBC  TROPONIN I (HIGH SENSITIVITY)  TROPONIN I (HIGH SENSITIVITY)    ____________________________________________  EKG: normal sinus rhythm, ventricular rate 70.  PR 160, QRS 80, QTc 434. ________________________________________  RADIOLOGY All imaging, including plain films, CT scans, and ultrasounds, independently reviewed by me, and interpretations confirmed via formal radiology reads.  ED MD interpretation:   CXR: Clear, pulm nodule - unchanged on my review  Official radiology report(s): Dg Chest 2 View  Result Date: 01/10/2019 CLINICAL DATA:  Patient states that difficulty breathing, productive cough which is clear, and congestion, sob X2 week. Former smokerHx of hypertension, atherosclerotic cardiovascular disease, aneurysm of aorta EXAM: CHEST - 2 VIEW COMPARISON:  Chest radiographs, 05/25/2013.  Chest CT, 09/07/2015. FINDINGS: Cardiac silhouette is normal in size. No mediastinal or hilar masses or evidence of adenopathy. There is significant dilation of the descending thoracic aorta stable from the prior exams. Nodule projecting over the right lung base is consistent  with a nipple shadow. Small possible nodule projects in the right upper lobe, only visualized on the frontal view. No other nodules. Mild prominence of the bronchovascular markings. No evidence of pneumonia or pulmonary edema. No pleural effusion or pneumothorax. Skeletal structures are intact. IMPRESSION: 1. No acute cardiopulmonary disease. 2. Possible right upper lobe nodule. Recommend follow-up chest CT without contrast for further assessment. 3. Stable thoracic aortic aneurysm. Electronically Signed   By: Amie Portlandavid  Ormond M.D.   On: 01/10/2019 11:11    ____________________________________________  PROCEDURES   Procedure(s) performed (including Critical Care):  Procedures  ____________________________________________  INITIAL IMPRESSION / MDM / ASSESSMENT AND PLAN / ED COURSE  As part of my medical decision making, I reviewed the following data within the electronic MEDICAL RECORD NUMBER Notes from prior ED visits and [place] Controlled Substance Database      *Larina BrasRickey Atwood was evaluated in Emergency Department on 01/10/2019 for the symptoms described in the history of present illness. He was evaluated in the context of the global COVID-19 pandemic, which necessitated consideration that the patient might be at risk for infection with the SARS-CoV-2 virus that causes COVID-19. Institutional protocols and algorithms that pertain to the evaluation of patients at risk for COVID-19 are in a state of rapid change based on information released by regulatory bodies including the CDC and federal and state organizations. These policies and algorithms were followed during the patient's care in the ED.  Some ED evaluations and interventions may be delayed as a result of limited staffing during the pandemic.*      Medical Decision Making: 68 year old male here with shortness of breath and wheezing.  Suspect acute on chronic bronchitis versus COPD.  He is satting greater than 90% on room air.  Normal work of  breathing, speaking in full sentences.  No lower extremity edema or symptoms or signs of CHF.  Symptoms are not consistent with PE.  Feels much better after prednisone and breathing treatment.  Chest x-ray clear.  Labs reassuring.  Will check outpatient Covid and treat with steroids  and doxycycline given his sputum production.  I have also called in an albuterol inhaler.  ____________________________________________  FINAL CLINICAL IMPRESSION(S) / ED DIAGNOSES  Final diagnoses:  Acute bronchitis, unspecified organism     MEDICATIONS GIVEN DURING THIS VISIT:  Medications  predniSONE (DELTASONE) tablet 60 mg (60 mg Oral Given 01/10/19 1157)  ipratropium-albuterol (DUONEB) 0.5-2.5 (3) MG/3ML nebulizer solution 3 mL (3 mLs Nebulization Given 01/10/19 1158)     ED Discharge Orders         Ordered    predniSONE (DELTASONE) 20 MG tablet  Daily     01/10/19 1235    doxycycline (VIBRAMYCIN) 100 MG capsule  2 times daily     01/10/19 1235    HYDROcodone-homatropine (HYCODAN) 5-1.5 MG/5ML syrup  Every 6 hours PRN     01/10/19 1235           Note:  This document was prepared using Dragon voice recognition software and may include unintentional dictation errors.   Shaune Pollack, MD 01/10/19 1245

## 2019-01-10 NOTE — ED Triage Notes (Addendum)
Patient c/o difficulty breathing, new cough, and congestion X2 week. Hx bronchitis unlabored breathing in triage. Able to talk in complete sentences with no problems. No inhaler at home

## 2019-01-11 LAB — NOVEL CORONAVIRUS, NAA (HOSP ORDER, SEND-OUT TO REF LAB; TAT 18-24 HRS): SARS-CoV-2, NAA: NOT DETECTED

## 2019-01-20 ENCOUNTER — Other Ambulatory Visit: Payer: Self-pay

## 2019-01-20 ENCOUNTER — Ambulatory Visit (INDEPENDENT_AMBULATORY_CARE_PROVIDER_SITE_OTHER): Payer: Medicare HMO | Admitting: Internal Medicine

## 2019-01-20 ENCOUNTER — Encounter: Payer: Self-pay | Admitting: Internal Medicine

## 2019-01-20 VITALS — BP 134/74 | HR 76 | Temp 98.3°F | Ht 66.0 in | Wt 159.0 lb

## 2019-01-20 DIAGNOSIS — B370 Candidal stomatitis: Secondary | ICD-10-CM

## 2019-01-20 DIAGNOSIS — R0602 Shortness of breath: Secondary | ICD-10-CM

## 2019-01-20 MED ORDER — NYSTATIN 100000 UNIT/ML MT SUSP: 5.0000 mL | Freq: Four times a day (QID) | OROMUCOSAL | 0 refills | Status: DC

## 2019-01-20 NOTE — Progress Notes (Signed)
Date:  01/20/2019   Name:  Trevor Werner   DOB:  06/16/1950   MRN:  [id number]   Chief Complaint: Cough (Follow up from ER. Patient had severe SOB at the time. Pt tested NEG for Covid on 01/10/2019. Patient currently has no fever. Patient still has cough.  Finished abx. Still have SOB but EKG and Chest XR was clear. Change in taste in the last 7 days. Throat is slightly sore, and has sore red spots on tongue. SOB when walking short distance. Doxycycline X 7 days , and prednisone for 5 days did not help. )  Shortness of Breath This is a chronic problem. The problem has been waxing and waning. Pertinent negatives include no abdominal pain, chest pain, fever, headaches, leg swelling or wheezing. The symptoms are aggravated by occupational exposure and smoke. He has tried oral steroids and beta agonist inhalers for the symptoms.  He finished Doxycycline and prednisone taper.  He admits to less shortness of breath but still not at baseline.  On more questioning, he has had recurrent bronchitis and cough/wheezing since he was young.  He has never seen a pulmonologist. He is currently using albuterol from UC.  It seems to help with SOB briefly. Over the past few days he has noticed that his mouth burns, his tongue is sore and his taste is off - everything has a metallic taste. He denies fever, chills, sputum, n/v/d, dizziness.  He is an ex smoker of many years.  Review of Systems  Constitutional: Negative for chills, fatigue, fever and unexpected weight change.  HENT: Positive for mouth sores. Negative for trouble swallowing.   Respiratory: Positive for shortness of breath. Negative for choking and wheezing.   Cardiovascular: Negative for chest pain, palpitations and leg swelling.  Gastrointestinal: Negative for abdominal pain, diarrhea and nausea.  Neurological: Negative for dizziness, light-headedness and headaches.    Patient Active Problem List   Diagnosis Date Noted  . Fasting hyperglycemia  06/14/2018  . Rising PSA level 05/18/2016  . CAD (coronary artery disease) 11/01/2015  . Pulmonary nodule, right 05/17/2015  . Edema leg 02/02/2015  . AAA (abdominal aortic aneurysm) without rupture ([place]) 07/28/2014  . Benign essential HTN 07/28/2014  . Reflux esophagitis 03/28/2010  . Hyperlipidemia 01/04/2010  . 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 CATARACT EXTRACTION Bilateral   . COLONOSCOPY  2011  . HAND SURGERY Right 1974  . RHINOPLASTY      Social History   Tobacco Use  . Smoking status: Former Smoker    Packs/day: 2.00    Years: 15.00    Pack years: 30.00    Types: Cigarettes    Quit date: 03/20/1978    Years since quitting: 40.8  . Smokeless tobacco: Never Used  . Tobacco comment: smoking cessation materials not required  Substance Use Topics  . Alcohol use: Yes    Alcohol/week: 22.0 standard drinks    Types: 10 Shots of liquor, 12 Cans of beer per week  . Drug use: No     Medication list has been reviewed and updated.  Current Meds  Medication Sig  . albuterol (VENTOLIN HFA) 108 (90 Base) MCG/ACT inhaler Inhale 2 puffs into the lungs every 6 (six) hours as needed for wheezing or shortness of breath.  Marland Kitchen amLODipine (NORVASC) 5 MG tablet Take 1 tablet (5 mg total) by mouth daily.  Marland Kitchen aspirin EC 81 MG tablet Take 1 tablet by  mouth daily.  Marland Kitchen atorvastatin (LIPITOR) 10 MG tablet Take 1 tablet (10 mg total) by mouth at bedtime.  . carvedilol (COREG) 6.25 MG tablet Take 1 tablet (6.25 mg total) by mouth 2 (two) times daily.  . hydrochlorothiazide (HYDRODIURIL) 25 MG tablet Take 1 tablet by mouth once daily  . losartan (COZAAR) 100 MG tablet Take 1 tablet (100 mg total) by mouth daily.    PHQ 2/9 Scores 01/20/2019 05/22/2018 05/21/2017 05/21/2017  PHQ - 2 Score 0 0 0 0  PHQ- 9 Score 5 - 0 0    BP Readings from Last 3 Encounters:  01/20/19 134/74  01/10/19 (!) 137/96  05/22/18 126/78     Physical Exam Vitals signs and nursing note reviewed.  Constitutional:      General: He is not in acute distress.    Appearance: Normal appearance. He is well-developed.  HENT:     Head: Normocephalic and atraumatic.     Mouth/Throat:     Mouth: Oral lesions (Orosz semi adherent plaques on buccal mucosa) present.     Tongue: Lesions (Peaden patches) present.     Palate: No mass.     Pharynx: Oropharynx is clear. Uvula midline. No pharyngeal swelling or oropharyngeal exudate.  Cardiovascular:     Rate and Rhythm: Normal rate and regular rhythm.     Heart sounds: No murmur.  Pulmonary:     Effort: Pulmonary effort is normal. No accessory muscle usage, respiratory distress or retractions.     Breath sounds: No wheezing or rhonchi.  Musculoskeletal:     Right lower leg: No edema.     Left lower leg: No edema.  Lymphadenopathy:     Cervical: No cervical adenopathy.  Skin:    General: Skin is warm and dry.     Capillary Refill: Capillary refill takes less than 2 seconds.     Findings: No rash.  Neurological:     General: No focal deficit present.     Mental Status: He is alert and oriented to person, place, and time.  Psychiatric:        Attention and Perception: Attention normal.        Mood and Affect: Mood normal.        Behavior: Behavior normal.        Thought Content: Thought content normal.     Wt Readings from Last 3 Encounters:  01/20/19 159 lb (72.1 kg)  01/10/19 160 lb (72.6 kg)  05/22/18 165 lb (74.8 kg)    BP 134/74   Pulse 76   Temp 98.3 F (36.8 C) (Oral)   Ht 5\' 6"  (1.676 m)   Wt 159 lb (72.1 kg)   SpO2 97%   BMI 25.66 kg/m   Assessment and Plan: 1. Shortness of breath Recently treated for bronchitis/covid negative Still has SOB but with normal O2 sat Long hx of recurrent chest infections/symptoms Will start Trelegy - one puff daily and refer to Pulmonary - Ambulatory referral to Pulmonology  2. Thrush, oral Swish/gargle/spit qid - nystatin  (MYCOSTATIN) 100000 UNIT/ML suspension; Take 5 mLs (500,000 Units total) by mouth 4 (four) times daily.  Dispense: 60 mL; Refill: 0   Partially dictated using . Any errors are unintentional.  Animal nutritionist, MD [facility]  01/20/2019

## 2019-01-29 ENCOUNTER — Other Ambulatory Visit: Payer: Self-pay | Admitting: Specialist

## 2019-01-29 DIAGNOSIS — R918 Other nonspecific abnormal finding of lung field: Secondary | ICD-10-CM | POA: Diagnosis not present

## 2019-01-29 DIAGNOSIS — J310 Chronic rhinitis: Secondary | ICD-10-CM | POA: Diagnosis not present

## 2019-01-29 DIAGNOSIS — R05 Cough: Secondary | ICD-10-CM

## 2019-01-29 DIAGNOSIS — J449 Chronic obstructive pulmonary disease, unspecified: Secondary | ICD-10-CM | POA: Diagnosis not present

## 2019-01-29 DIAGNOSIS — R0600 Dyspnea, unspecified: Secondary | ICD-10-CM | POA: Diagnosis not present

## 2019-01-29 DIAGNOSIS — R053 Chronic cough: Secondary | ICD-10-CM

## 2019-01-29 DIAGNOSIS — R0609 Other forms of dyspnea: Secondary | ICD-10-CM

## 2019-02-07 ENCOUNTER — Ambulatory Visit
Admission: RE | Admit: 2019-02-07 | Discharge: 2019-02-07 | Disposition: A | Discharge status: Home / Self Care | Payer: Medicare HMO | Source: Ambulatory Visit | Attending: Specialist | Admitting: Specialist

## 2019-02-07 ENCOUNTER — Other Ambulatory Visit: Payer: Self-pay

## 2019-02-07 DIAGNOSIS — R0609 Other forms of dyspnea: Secondary | ICD-10-CM

## 2019-02-07 DIAGNOSIS — R0600 Dyspnea, unspecified: Secondary | ICD-10-CM | POA: Diagnosis not present

## 2019-02-07 DIAGNOSIS — R0602 Shortness of breath: Secondary | ICD-10-CM | POA: Diagnosis not present

## 2019-02-07 DIAGNOSIS — R918 Other nonspecific abnormal finding of lung field: Secondary | ICD-10-CM

## 2019-02-07 DIAGNOSIS — R053 Chronic cough: Secondary | ICD-10-CM

## 2019-02-07 DIAGNOSIS — R05 Cough: Secondary | ICD-10-CM | POA: Diagnosis not present

## 2019-02-19 DIAGNOSIS — R0600 Dyspnea, unspecified: Secondary | ICD-10-CM | POA: Diagnosis not present

## 2019-02-19 DIAGNOSIS — I712 Thoracic aortic aneurysm, without rupture: Secondary | ICD-10-CM | POA: Diagnosis not present

## 2019-02-19 DIAGNOSIS — R05 Cough: Secondary | ICD-10-CM | POA: Diagnosis not present

## 2019-02-19 DIAGNOSIS — J449 Chronic obstructive pulmonary disease, unspecified: Secondary | ICD-10-CM | POA: Diagnosis not present

## 2019-05-19 ENCOUNTER — Telehealth: Payer: Self-pay | Admitting: Internal Medicine

## 2019-05-19 NOTE — Telephone Encounter (Signed)
DECLINED

## 2019-05-26 ENCOUNTER — Ambulatory Visit (INDEPENDENT_AMBULATORY_CARE_PROVIDER_SITE_OTHER): Payer: Medicare HMO | Admitting: Internal Medicine

## 2019-05-26 ENCOUNTER — Other Ambulatory Visit: Payer: Self-pay

## 2019-05-26 ENCOUNTER — Encounter: Payer: Self-pay | Admitting: Internal Medicine

## 2019-05-26 VITALS — BP 124/78 | HR 75 | Temp 96.9°F | Ht 66.0 in | Wt 165.2 lb

## 2019-05-26 DIAGNOSIS — E782 Mixed hyperlipidemia: Secondary | ICD-10-CM | POA: Diagnosis not present

## 2019-05-26 DIAGNOSIS — I10 Essential (primary) hypertension: Secondary | ICD-10-CM | POA: Diagnosis not present

## 2019-05-26 DIAGNOSIS — I714 Abdominal aortic aneurysm, without rupture, unspecified: Secondary | ICD-10-CM

## 2019-05-26 DIAGNOSIS — J449 Chronic obstructive pulmonary disease, unspecified: Secondary | ICD-10-CM | POA: Diagnosis not present

## 2019-05-26 DIAGNOSIS — Z1211 Encounter for screening for malignant neoplasm of colon: Secondary | ICD-10-CM

## 2019-05-26 DIAGNOSIS — Z125 Encounter for screening for malignant neoplasm of prostate: Secondary | ICD-10-CM | POA: Diagnosis not present

## 2019-05-26 DIAGNOSIS — R739 Hyperglycemia, unspecified: Secondary | ICD-10-CM

## 2019-05-26 DIAGNOSIS — R972 Elevated prostate specific antigen [PSA]: Secondary | ICD-10-CM

## 2019-05-26 DIAGNOSIS — Z0000 Encounter for general adult medical examination without abnormal findings: Secondary | ICD-10-CM

## 2019-05-26 LAB — POCT URINALYSIS DIPSTICK
Bilirubin, UA: NEGATIVE
Blood, UA: NEGATIVE
Glucose, UA: NEGATIVE
Ketones, UA: NEGATIVE
Leukocytes, UA: NEGATIVE
Nitrite, UA: NEGATIVE
Protein, UA: NEGATIVE
Spec Grav, UA: 1.01 (ref 1.010–1.025)
Urobilinogen, UA: 0.2 E.U./dL
pH, UA: 6.5 (ref 5.0–8.0)

## 2019-05-26 MED ORDER — CARVEDILOL 6.25 MG PO TABS: 6.2500 mg | ORAL_TABLET | Freq: Two times a day (BID) | ORAL | 3 refills | Status: DC

## 2019-05-26 MED ORDER — AMLODIPINE BESYLATE 5 MG PO TABS: 5.0000 mg | ORAL_TABLET | Freq: Every day | ORAL | 3 refills | Status: DC

## 2019-05-26 MED ORDER — LOSARTAN POTASSIUM 100 MG PO TABS: 100.0000 mg | ORAL_TABLET | Freq: Every day | ORAL | 3 refills | Status: DC

## 2019-05-26 MED ORDER — HYDROCHLOROTHIAZIDE 25 MG PO TABS: 25.0000 mg | ORAL_TABLET | Freq: Every day | ORAL | 3 refills | Status: DC

## 2019-05-26 MED ORDER — ATORVASTATIN CALCIUM 10 MG PO TABS: 10.0000 mg | ORAL_TABLET | Freq: Every day | ORAL | 3 refills | Status: DC

## 2019-05-26 NOTE — Progress Notes (Signed)
Date:  05/26/2019   Name:  Trevor Werner   DOB:  1950/10/02   MRN:  [id number]   Chief Complaint: Annual Exam and COPD Trevor Werner is a 69 y.o. male who presents today for his Complete Annual Exam. He feels fairly well. He reports exercising started walking. He reports he is sleeping well.   Colonoscopy 08/2009 PSA  05/2018 Immunization History  Administered Date(s) Administered  . Influenza Whole 12/07/2009  . Influenza, High Dose Seasonal PF 12/24/2017  . Influenza, Quadrivalent, Recombinant, Inj, Pf 12/17/2018  . Influenza-Unspecified 12/26/2016, 12/24/2017, 12/17/2018  . PFIZER SARS-COV-2 Vaccination 05/22/2019  . Pneumococcal Conjugate-13 05/17/2015  . Pneumococcal Polysaccharide-23 05/17/2016  . Td 12/07/2009    Hypertension This is a chronic problem. The problem is controlled (at home 120/80). Associated symptoms include shortness of breath. Pertinent negatives include no chest pain, headaches or palpitations. Past treatments include diuretics, beta blockers, calcium channel blockers and angiotensin blockers. The current treatment provides significant improvement. There are no compliance problems.   Hyperlipidemia This is a chronic problem. The problem is controlled. Associated symptoms include shortness of breath. Pertinent negatives include no chest pain or myalgias. Current antihyperlipidemic treatment includes statins.  COPD - followed by Pulmonary Dr. Meredeth Ide.  Using [employer] and [employer]. He may have had some improvement. Will see Dr. Meredeth Ide again in May.  Lab Results  Component Value Date   CREATININE 0.97 01/10/2019   BUN 14 01/10/2019   NA 134 (L) 01/10/2019   K 3.5 01/10/2019   CL 101 01/10/2019   CO2 21 (L) 01/10/2019   Lab Results  Component Value Date   CHOL 197 05/22/2018   HDL 51 05/22/2018   LDLCALC 108 (H) 05/22/2018   LDLDIRECT 164.8 12/09/2009   TRIG 192 (H) 05/22/2018   CHOLHDL 3.9 05/22/2018   Lab Results  Component Value Date   TSH 2.580 05/17/2015   Lab Results  Component Value Date   HGBA1C 5.3 05/22/2018   Lab Results  Component Value Date   PSA1 3.5 05/22/2018   PSA1 3.2 05/21/2017   PSA1 4.2 (H) 11/15/2016   PSA1 4.2 (H) 11/15/2016   PSA 3.1 07/02/2012   PSA 1.93 12/09/2009    Review of Systems  Constitutional: Negative for appetite change, chills, diaphoresis, fatigue and unexpected weight change.  HENT: Negative for hearing loss, tinnitus, trouble swallowing and voice change.   Eyes: Negative for visual disturbance.  Respiratory: Positive for cough and shortness of breath. Negative for choking and wheezing.   Cardiovascular: Positive for leg swelling (mild swelling in left foot). Negative for chest pain and palpitations.  Gastrointestinal: Negative for abdominal pain, blood in stool, constipation and diarrhea.  Genitourinary: Negative for difficulty urinating, dysuria and frequency.  Musculoskeletal: Negative for arthralgias, back pain and myalgias.  Skin: Negative for color change and rash.  Allergic/Immunologic: Negative for environmental allergies.  Neurological: Negative for dizziness, syncope and headaches.  Hematological: Negative for adenopathy.  Psychiatric/Behavioral: Negative for dysphoric mood and sleep disturbance. The patient is not nervous/anxious.     Patient Active Problem List   Diagnosis Date Noted  . Chronic obstructive pulmonary disease (HCC) 05/26/2019  . Fasting hyperglycemia 06/14/2018  . Rising PSA level 05/18/2016  . CAD (coronary artery disease) 11/01/2015  . Pulmonary nodule, right 05/17/2015  . Edema leg 02/02/2015  . AAA (abdominal aortic aneurysm) without rupture (HCC) 07/28/2014  . Benign essential HTN 07/28/2014  . Reflux esophagitis 03/28/2010  . Hyperlipidemia 01/04/2010  . Atrophic gastritis 01/04/2010  No Known Allergies  Past Surgical History:  Procedure Laterality Date  . CATARACT EXTRACTION Bilateral   . COLONOSCOPY  2011  . HAND SURGERY  Right 1974  . RHINOPLASTY      Social History   Tobacco Use  . Smoking status: Former Smoker    Packs/day: 2.00    Years: 15.00    Pack years: 30.00    Types: Cigarettes    Quit date: 03/20/1978    Years since quitting: 41.2  . Smokeless tobacco: Never Used  . Tobacco comment: smoking cessation materials not required  Substance Use Topics  . Alcohol use: Yes    Alcohol/week: 22.0 standard drinks    Types: 10 Shots of liquor, 12 Cans of beer per week  . Drug use: No     Medication list has been reviewed and updated.  Current Meds  Medication Sig  . albuterol (VENTOLIN HFA) 108 (90 Base) MCG/ACT inhaler Inhale 2 puffs into the lungs every 6 (six) hours as needed for wheezing or shortness of breath.  Marland Kitchen amLODipine (NORVASC) 5 MG tablet Take 1 tablet (5 mg total) by mouth daily.  Marland Kitchen aspirin EC 81 MG tablet Take 1 tablet by mouth daily.  Marland Kitchen atorvastatin (LIPITOR) 10 MG tablet Take 1 tablet (10 mg total) by mouth at bedtime.  . carvedilol (COREG) 6.25 MG tablet Take 1 tablet (6.25 mg total) by mouth 2 (two) times daily.  Marland Kitchen DALIRESP 250 MCG TABS   . fexofenadine (ALLEGRA) 180 MG tablet Take by mouth as needed.   . hydrochlorothiazide (HYDRODIURIL) 25 MG tablet Take 1 tablet (25 mg total) by mouth daily.  Marland Kitchen losartan (COZAAR) 100 MG tablet Take 1 tablet (100 mg total) by mouth daily.  Marland Kitchen nystatin (MYCOSTATIN) 100000 UNIT/ML suspension Take 5 mLs (500,000 Units total) by mouth 4 (four) times daily.  . TRELEGY ELLIPTA 100-62.5-25 MCG/INH AEPB   . [DISCONTINUED] amLODipine (NORVASC) 5 MG tablet Take 1 tablet (5 mg total) by mouth daily.  . [DISCONTINUED] atorvastatin (LIPITOR) 10 MG tablet Take 1 tablet (10 mg total) by mouth at bedtime.  . [DISCONTINUED] carvedilol (COREG) 6.25 MG tablet Take 1 tablet (6.25 mg total) by mouth 2 (two) times daily.  . [DISCONTINUED] hydrochlorothiazide (HYDRODIURIL) 25 MG tablet Take 1 tablet by mouth once daily  . [DISCONTINUED] losartan (COZAAR) 100 MG  tablet Take 1 tablet (100 mg total) by mouth daily.    PHQ 2/9 Scores 05/26/2019 01/20/2019 05/22/2018 05/21/2017  PHQ - 2 Score - 0 0 0  PHQ- 9 Score - 5 - 0  Exception Documentation Patient refusal - - -    BP Readings from Last 3 Encounters:  05/26/19 124/78  01/20/19 134/74  01/10/19 (!) 137/96    Physical Exam Vitals and nursing note reviewed.  Constitutional:      Appearance: Normal appearance. He is well-developed.  HENT:     Head: Normocephalic.     Right Ear: Tympanic membrane, ear canal and external ear normal.     Left Ear: Tympanic membrane, ear canal and external ear normal.     Nose: Nose normal.     Mouth/Throat:     Pharynx: Uvula midline.  Eyes:     Conjunctiva/sclera: Conjunctivae normal.     Pupils: Pupils are equal, round, and reactive to light.  Neck:     Thyroid: No thyromegaly.     Vascular: No carotid bruit.  Cardiovascular:     Rate and Rhythm: Normal rate and regular rhythm.     Heart  sounds: Normal heart sounds.  Pulmonary:     Effort: Pulmonary effort is normal.     Breath sounds: Normal breath sounds. No wheezing.  Chest:     Breasts:        Right: No mass.        Left: No mass.  Abdominal:     General: Bowel sounds are normal.     Palpations: Abdomen is soft.     Tenderness: There is no abdominal tenderness.  Musculoskeletal:        General: Normal range of motion.     Cervical back: Normal range of motion and neck supple.     Right lower leg: No edema.     Left lower leg: No edema.  Lymphadenopathy:     Cervical: No cervical adenopathy.  Skin:    General: Skin is warm and dry.     Capillary Refill: Capillary refill takes less than 2 seconds.     Findings: No lesion.  Neurological:     General: No focal deficit present.     Mental Status: He is alert and oriented to person, place, and time.     Deep Tendon Reflexes: Reflexes are normal and symmetric.  Psychiatric:        Speech: Speech normal.        Behavior: Behavior normal.          Thought Content: Thought content normal.        Judgment: Judgment normal.     Wt Readings from Last 3 Encounters:  05/26/19 165 lb 3.2 oz (74.9 kg)  01/20/19 159 lb (72.1 kg)  01/10/19 160 lb (72.6 kg)    BP 124/78   Pulse 75   Temp (!) 96.9 F (36.1 C)   Ht 5\' 6"  (1.676 m)   Wt 165 lb 3.2 oz (74.9 kg)   SpO2 98%   BMI 26.66 kg/m   Assessment and Plan: 1. Annual physical exam Normal exam Continue healthy diet, exercise as able - POCT urinalysis dipstick  2. Prostate cancer screening DRE deferred  3. Benign essential HTN Clinically stable exam with well controlled BP on 4 agents. Tolerating medications without side effects at this time. Pt to continue current regimen and low sodium diet; benefits of regular exercise as able discussed. - CBC with Differential/Platelet - Comprehensive metabolic panel - amLODipine (NORVASC) 5 MG tablet; Take 1 tablet (5 mg total) by mouth daily.  Dispense: 90 tablet; Refill: 3 - carvedilol (COREG) 6.25 MG tablet; Take 1 tablet (6.25 mg total) by mouth 2 (two) times daily.  Dispense: 180 tablet; Refill: 3 - hydrochlorothiazide (HYDRODIURIL) 25 MG tablet; Take 1 tablet (25 mg total) by mouth daily.  Dispense: 90 tablet; Refill: 3 - losartan (COZAAR) 100 MG tablet; Take 1 tablet (100 mg total) by mouth daily.  Dispense: 90 tablet; Refill: 3  4. Mixed hyperlipidemia Tolerating statin medication without side effects at this time LDL is at goal of < 70 on current dose Continue same therapy without change at this time. - Lipid panel - atorvastatin (LIPITOR) 10 MG tablet; Take 1 tablet (10 mg total) by mouth at bedtime.  Dispense: 90 tablet; Refill: 3  5. Rising PSA level DRE deferred - PSA  6. Chronic obstructive pulmonary disease, unspecified COPD type (HCC) Now on several medications with some improvement in cough and SOB First Covid vaccine received Pneumonia vaccines up to date Followed by Pulmonary  7. Colon cancer  screening Pt would like to defer to next year -  last one 12/2009 was completely normal  8. Blood glucose elevated - Hemoglobin A1c  9. AAA (abdominal aortic aneurysm) without rupture (HCC) Has been seen and evaluated by Vascular surgery Pt elected no surgery based on risk of significant morbidity/mortality He also elected no further imaging to be done routinely   Partially dictated using Dragon software. Any errors are unintentional.  Bari Edward, MD [facility]  05/26/2019

## 2019-05-27 LAB — LIPID PANEL
Chol/HDL Ratio: 2.9 ratio (ref 0.0–5.0)
Cholesterol, Total: 161 mg/dL (ref 100–199)
HDL: 55 mg/dL (ref >39)
LDL Chol Calc (NIH): 83 mg/dL (ref 0–99)
Triglycerides: 131 mg/dL (ref 0–149)
VLDL Cholesterol Cal: 23 mg/dL (ref 5–40)

## 2019-05-27 LAB — HEMOGLOBIN A1C
Est. average glucose Bld gHb Est-mCnc: 103 mg/dL
Hgb A1c MFr Bld: 5.2 % (ref 4.8–5.6)

## 2019-05-27 LAB — COMPREHENSIVE METABOLIC PANEL
ALT: 19 IU/L (ref 0–44)
AST: 26 IU/L (ref 0–40)
Albumin/Globulin Ratio: 1.8 (ref 1.2–2.2)
Albumin: 4.7 g/dL (ref 3.8–4.8)
Alkaline Phosphatase: 91 IU/L (ref 39–117)
BUN/Creatinine Ratio: 12 (ref 10–24)
BUN: 14 mg/dL (ref 8–27)
Bilirubin Total: 1.2 mg/dL (ref 0.0–1.2)
CO2: 23 mmol/L (ref 20–29)
Calcium: 9.9 mg/dL (ref 8.6–10.2)
Chloride: 101 mmol/L (ref 96–106)
Creatinine, Ser: 1.14 mg/dL (ref 0.76–1.27)
GFR calc Af Amer: 75 mL/min/{1.73_m2} (ref >59)
GFR calc non Af Amer: 65 mL/min/{1.73_m2} (ref >59)
Globulin, Total: 2.6 g/dL (ref 1.5–4.5)
Glucose: 109 mg/dL — ABNORMAL HIGH (ref 65–99)
Potassium: 4.2 mmol/L (ref 3.5–5.2)
Sodium: 139 mmol/L (ref 134–144)
Total Protein: 7.3 g/dL (ref 6.0–8.5)

## 2019-05-27 LAB — CBC WITH DIFFERENTIAL/PLATELET
Basophils Absolute: 0.1 10*3/uL (ref 0.0–0.2)
Basos: 1 %
EOS (ABSOLUTE): 0.1 10*3/uL (ref 0.0–0.4)
Eos: 2 %
Hematocrit: 48.4 % (ref 37.5–51.0)
Hemoglobin: 16.2 g/dL (ref 13.0–17.7)
Immature Grans (Abs): 0.1 10*3/uL (ref 0.0–0.1)
Immature Granulocytes: 1 %
Lymphocytes Absolute: 1.9 10*3/uL (ref 0.7–3.1)
Lymphs: 28 %
MCH: 32 pg (ref 26.6–33.0)
MCHC: 33.5 g/dL (ref 31.5–35.7)
MCV: 96 fL (ref 79–97)
Monocytes Absolute: 0.7 10*3/uL (ref 0.1–0.9)
Monocytes: 11 %
Neutrophils Absolute: 3.7 10*3/uL (ref 1.4–7.0)
Neutrophils: 57 %
Platelets: 220 10*3/uL (ref 150–450)
RBC: 5.07 x10E6/uL (ref 4.14–5.80)
RDW: 12.6 % (ref 11.6–15.4)
WBC: 6.5 10*3/uL (ref 3.4–10.8)

## 2019-05-27 LAB — PSA: Prostate Specific Ag, Serum: 2.6 ng/mL (ref 0.0–4.0)

## 2019-07-16 ENCOUNTER — Encounter: Payer: Self-pay | Admitting: Internal Medicine

## 2019-07-23 DIAGNOSIS — J439 Emphysema, unspecified: Secondary | ICD-10-CM | POA: Diagnosis not present

## 2019-07-23 DIAGNOSIS — I712 Thoracic aortic aneurysm, without rupture: Secondary | ICD-10-CM | POA: Diagnosis not present

## 2019-07-23 DIAGNOSIS — R0600 Dyspnea, unspecified: Secondary | ICD-10-CM | POA: Diagnosis not present

## 2019-10-23 DIAGNOSIS — R918 Other nonspecific abnormal finding of lung field: Secondary | ICD-10-CM | POA: Diagnosis not present

## 2019-10-23 DIAGNOSIS — R0600 Dyspnea, unspecified: Secondary | ICD-10-CM | POA: Diagnosis not present

## 2019-10-23 DIAGNOSIS — R0602 Shortness of breath: Secondary | ICD-10-CM | POA: Diagnosis not present

## 2019-10-23 DIAGNOSIS — I712 Thoracic aortic aneurysm, without rupture: Secondary | ICD-10-CM | POA: Diagnosis not present

## 2019-12-04 DIAGNOSIS — R0989 Other specified symptoms and signs involving the circulatory and respiratory systems: Secondary | ICD-10-CM | POA: Diagnosis not present

## 2019-12-04 DIAGNOSIS — R0600 Dyspnea, unspecified: Secondary | ICD-10-CM | POA: Diagnosis not present

## 2019-12-04 DIAGNOSIS — J439 Emphysema, unspecified: Secondary | ICD-10-CM | POA: Diagnosis not present

## 2020-05-26 ENCOUNTER — Ambulatory Visit (INDEPENDENT_AMBULATORY_CARE_PROVIDER_SITE_OTHER): Payer: Medicare HMO | Admitting: Internal Medicine

## 2020-05-26 ENCOUNTER — Encounter: Payer: Self-pay | Admitting: Internal Medicine

## 2020-05-26 ENCOUNTER — Other Ambulatory Visit: Payer: Self-pay

## 2020-05-26 VITALS — BP 126/88 | HR 73 | Temp 98.2°F | Ht 66.0 in | Wt 170.0 lb

## 2020-05-26 DIAGNOSIS — I2510 Atherosclerotic heart disease of native coronary artery without angina pectoris: Secondary | ICD-10-CM | POA: Diagnosis not present

## 2020-05-26 DIAGNOSIS — J439 Emphysema, unspecified: Secondary | ICD-10-CM | POA: Diagnosis not present

## 2020-05-26 DIAGNOSIS — I10 Essential (primary) hypertension: Secondary | ICD-10-CM | POA: Diagnosis not present

## 2020-05-26 DIAGNOSIS — Z0000 Encounter for general adult medical examination without abnormal findings: Secondary | ICD-10-CM

## 2020-05-26 DIAGNOSIS — Z1211 Encounter for screening for malignant neoplasm of colon: Secondary | ICD-10-CM

## 2020-05-26 DIAGNOSIS — R972 Elevated prostate specific antigen [PSA]: Secondary | ICD-10-CM | POA: Diagnosis not present

## 2020-05-26 DIAGNOSIS — I714 Abdominal aortic aneurysm, without rupture, unspecified: Secondary | ICD-10-CM

## 2020-05-26 DIAGNOSIS — E782 Mixed hyperlipidemia: Secondary | ICD-10-CM | POA: Diagnosis not present

## 2020-05-26 DIAGNOSIS — R7309 Other abnormal glucose: Secondary | ICD-10-CM | POA: Diagnosis not present

## 2020-05-26 MED ORDER — AMLODIPINE BESYLATE 5 MG PO TABS: 5.0000 mg | ORAL_TABLET | Freq: Every day | ORAL | 3 refills | Status: DC

## 2020-05-26 MED ORDER — ATORVASTATIN CALCIUM 10 MG PO TABS: 10.0000 mg | ORAL_TABLET | Freq: Every day | ORAL | 3 refills | Status: DC

## 2020-05-26 MED ORDER — LOSARTAN POTASSIUM 100 MG PO TABS: 100.0000 mg | ORAL_TABLET | Freq: Every day | ORAL | 3 refills | Status: DC

## 2020-05-26 MED ORDER — HYDROCHLOROTHIAZIDE 25 MG PO TABS: 25.0000 mg | ORAL_TABLET | Freq: Every day | ORAL | 3 refills | Status: DC

## 2020-05-26 MED ORDER — CARVEDILOL 6.25 MG PO TABS: 6.2500 mg | ORAL_TABLET | Freq: Two times a day (BID) | ORAL | 3 refills | Status: DC

## 2020-05-26 NOTE — Progress Notes (Signed)
Date:  05/26/2020   Name:  Trevor Werner   DOB:  10/13/1950   MRN:  [id number]   Chief Complaint: Annual Exam (Pt feels SOB today , sees Dr. Mayo Ao )  Trevor Werner is a 70 y.o. male who presents today for his Complete Annual Exam. He feels well. He reports exercising none. He reports he is sleeping well.   Colonoscopy: 01/2010 - due  Immunization History  Administered Date(s) Administered  . Fluad Quad(high Dose 65+) 12/05/2019  . Influenza Whole 12/07/2009  . Influenza, High Dose Seasonal PF 12/24/2017  . Influenza, Quadrivalent, Recombinant, Inj, Pf 12/17/2018  . Influenza-Unspecified 12/26/2016, 12/24/2017, 12/17/2018  . PFIZER(Purple Top)SARS-COV-2 Vaccination 05/22/2019, 06/12/2019, 12/15/2019  . Pneumococcal Conjugate-13 05/17/2015  . Pneumococcal Polysaccharide-23 05/17/2016  . Td 12/07/2009    Hypertension This is a chronic problem. The problem is controlled. Pertinent negatives include no chest pain, headaches, palpitations or shortness of breath. Past treatments include angiotensin blockers, beta blockers, calcium channel blockers and diuretics. The current treatment provides significant improvement. There are no compliance problems.  Hypertensive end-organ damage includes CAD/MI.  Hyperlipidemia This is a chronic problem. Pertinent negatives include no chest pain, focal weakness, myalgias or shortness of breath. Current antihyperlipidemic treatment includes statins (no side effects to lipitor).  COPD - sees Pulmonary, not using Trelegy, only albuterol.  Having more SOB with activity that may be improving.  He also tried Daliresp without much benefit.   Lab Results  Component Value Date   CREATININE 1.14 05/26/2019   BUN 14 05/26/2019   NA 139 05/26/2019   K 4.2 05/26/2019   CL 101 05/26/2019   CO2 23 05/26/2019   Lab Results  Component Value Date   CHOL 161 05/26/2019   HDL 55 05/26/2019   LDLCALC 83 05/26/2019   LDLDIRECT 164.8 12/09/2009   TRIG 131  05/26/2019   CHOLHDL 2.9 05/26/2019   Lab Results  Component Value Date   TSH 2.580 05/17/2015   Lab Results  Component Value Date   HGBA1C 5.2 05/26/2019   Lab Results  Component Value Date   WBC 6.5 05/26/2019   HGB 16.2 05/26/2019   HCT 48.4 05/26/2019   MCV 96 05/26/2019   PLT 220 05/26/2019   Lab Results  Component Value Date   ALT 19 05/26/2019   AST 26 05/26/2019   ALKPHOS 91 05/26/2019   BILITOT 1.2 05/26/2019   Lab Results  Component Value Date   PSA1 2.6 05/26/2019   PSA1 3.5 05/22/2018   PSA1 3.2 05/21/2017   PSA 3.1 07/02/2012   PSA 1.93 12/09/2009       Review of Systems  Constitutional: Negative for appetite change, chills, diaphoresis, fatigue and unexpected weight change.  HENT: Negative for hearing loss, tinnitus, trouble swallowing and voice change.   Eyes: Negative for visual disturbance.  Respiratory: Negative for choking, shortness of breath and wheezing.   Cardiovascular: Negative for chest pain, palpitations and leg swelling.  Gastrointestinal: Negative for abdominal pain, blood in stool, constipation and diarrhea.  Genitourinary: Negative for difficulty urinating, dysuria and frequency.  Musculoskeletal: Negative for arthralgias, back pain and myalgias.  Skin: Negative for color change and rash.  Neurological: Negative for dizziness, focal weakness, syncope and headaches.  Hematological: Negative for adenopathy.  Psychiatric/Behavioral: Negative for dysphoric mood and sleep disturbance.    Patient Active Problem List   Diagnosis Date Noted  . Chronic obstructive pulmonary disease (HCC) 05/26/2019  . Fasting hyperglycemia 06/14/2018  . Rising PSA level 05/18/2016  .  CAD (coronary artery disease) 11/01/2015  . Pulmonary nodule, right 05/17/2015  . Edema leg 02/02/2015  . AAA (abdominal aortic aneurysm) without rupture (HCC) 07/28/2014  . Benign essential HTN 07/28/2014  . Reflux esophagitis 03/28/2010  . Hyperlipidemia 01/04/2010   . 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 CATARACT EXTRACTION Bilateral   . COLONOSCOPY  2011  . HAND SURGERY Right 1974  . RHINOPLASTY      Social History   Tobacco Use  . Smoking status: Former Smoker    Packs/day: 2.00    Years: 15.00    Pack years: 30.00    Types: Cigarettes    Quit date: 03/20/1978    Years since quitting: 42.2  . Smokeless tobacco: Never Used  . Tobacco comment: smoking cessation materials not required  Vaping Use  . Vaping Use: Never used  Substance Use Topics  . Alcohol use: Yes    Alcohol/week: 22.0 standard drinks    Types: 10 Shots of liquor, 12 Cans of beer per week  . Drug use: No     Medication list has been reviewed and updated.  Current Meds  Medication Sig  . albuterol (VENTOLIN HFA) 108 (90 Base) MCG/ACT inhaler Inhale 2 puffs into the lungs every 6 (six) hours as needed for wheezing or shortness of breath.  Marland Kitchen amLODipine (NORVASC) 5 MG tablet Take 1 tablet (5 mg total) by mouth daily.  Marland Kitchen aspirin EC 81 MG tablet Take 1 tablet by mouth daily.  Marland Kitchen atorvastatin (LIPITOR) 10 MG tablet Take 1 tablet (10 mg total) by mouth at bedtime.  . carvedilol (COREG) 6.25 MG tablet Take 1 tablet (6.25 mg total) by mouth 2 (two) times daily.  Marland Kitchen DALIRESP 250 MCG TABS   . hydrochlorothiazide (HYDRODIURIL) 25 MG tablet Take 1 tablet (25 mg total) by mouth daily.  Marland Kitchen losartan (COZAAR) 100 MG tablet Take 1 tablet (100 mg total) by mouth daily.    PHQ 2/9 Scores 05/26/2020 05/26/2019 01/20/2019 05/22/2018  PHQ - 2 Score 0 - 0 0  PHQ- 9 Score 0 - 5 -  Exception Documentation - Patient refusal - -    GAD 7 : Generalized Anxiety Score 05/26/2020  Nervous, Anxious, on Edge 0  Control/stop worrying 0  Worry too much - different things 0  Trouble relaxing 0  Restless 0  Easily annoyed or irritable 0  Afraid - awful might happen 0  Total GAD 7 Score 0    BP Readings from Last 3 Encounters:   05/26/20 126/88  05/26/19 124/78  01/20/19 134/74    Physical Exam Vitals and nursing note reviewed.  Constitutional:      Appearance: Normal appearance. He is well-developed.  HENT:     Head: Normocephalic.     Right Ear: Tympanic membrane, ear canal and external ear normal.     Left Ear: Tympanic membrane, ear canal and external ear normal.     Nose: Nose normal.  Eyes:     Conjunctiva/sclera: Conjunctivae normal.     Pupils: Pupils are equal, round, and reactive to light.  Neck:     Thyroid: No thyromegaly.     Vascular: No carotid bruit.  Cardiovascular:     Rate and Rhythm: Normal rate and regular rhythm.     Heart sounds: Normal heart sounds.  Pulmonary:     Effort: Pulmonary effort is normal.     Breath sounds: Normal breath sounds. No wheezing.  Chest:  Breasts:     Right: No mass.     Left: No mass.    Abdominal:     General: Bowel sounds are normal.     Palpations: Abdomen is soft.     Tenderness: There is no abdominal tenderness.  Musculoskeletal:        General: Normal range of motion.     Cervical back: Normal range of motion and neck supple.     Right lower leg: No edema.     Left lower leg: No edema.  Lymphadenopathy:     Cervical: No cervical adenopathy.  Skin:    General: Skin is warm and dry.     Capillary Refill: Capillary refill takes less than 2 seconds.  Neurological:     Mental Status: He is alert and oriented to person, place, and time.     Deep Tendon Reflexes: Reflexes are normal and symmetric.  Psychiatric:        Attention and Perception: Attention normal.        Mood and Affect: Mood normal.        Thought Content: Thought content normal.     Wt Readings from Last 3 Encounters:  05/26/20 170 lb (77.1 kg)  05/26/19 165 lb 3.2 oz (74.9 kg)  01/20/19 159 lb (72.1 kg)    BP 126/88   Pulse 73   Temp 98.2 F (36.8 C) (Oral)   Ht 5\' 6"  (1.676 m)   Wt 170 lb (77.1 kg)   SpO2 97%   BMI 27.44 kg/m   Assessment and Plan: 1.  Annual physical exam Normal exam Continue healthy diet, regular exercise advised  2. Colon cancer screening Pt declines at this time due to age and co-mobidities  3. Benign essential HTN Clinically stable exam with well controlled BP. Tolerating medications without side effects at this time. Pt to continue current regimen and low sodium diet; benefits of regular exercise as able discussed. - CBC with Differential/Platelet - Comprehensive metabolic panel - amLODipine (NORVASC) 5 MG tablet; Take 1 tablet (5 mg total) by mouth daily.  Dispense: 90 tablet; Refill: 3 - carvedilol (COREG) 6.25 MG tablet; Take 1 tablet (6.25 mg total) by mouth 2 (two) times daily.  Dispense: 180 tablet; Refill: 3 - hydrochlorothiazide (HYDRODIURIL) 25 MG tablet; Take 1 tablet (25 mg total) by mouth daily.  Dispense: 90 tablet; Refill: 3 - losartan (COZAAR) 100 MG tablet; Take 1 tablet (100 mg total) by mouth daily.  Dispense: 90 tablet; Refill: 3  4. Coronary artery disease involving native coronary artery of native heart without angina pectoris Stable, continue beta blockers  5. Pulmonary emphysema, unspecified emphysema type (HCC) Followed by Pulmonary Consider nebulized budesonide for cost savings  6. Mixed hyperlipidemia Check labs now on lipitor - Lipid panel - atorvastatin (LIPITOR) 10 MG tablet; Take 1 tablet (10 mg total) by mouth at bedtime.  Dispense: 90 tablet; Refill: 3  7. Rising PSA level This had stabilized last visit If higher will refer to Urology - PSA  8. AAA (abdominal aortic aneurysm) without rupture (HCC) Pt continues to decline surgical intervention and further monitoring   Partially dictated using . Any errors are unintentional.  Animal nutritionist, MD [facility]  05/26/2020

## 2020-05-27 LAB — COMPREHENSIVE METABOLIC PANEL
ALT: 18 IU/L (ref 0–44)
AST: 23 IU/L (ref 0–40)
Albumin/Globulin Ratio: 1.5 (ref 1.2–2.2)
Albumin: 4.5 g/dL (ref 3.8–4.8)
Alkaline Phosphatase: 79 IU/L (ref 44–121)
BUN/Creatinine Ratio: 12 (ref 10–24)
BUN: 13 mg/dL (ref 8–27)
Bilirubin Total: 1.5 mg/dL — ABNORMAL HIGH (ref 0.0–1.2)
CO2: 24 mmol/L (ref 20–29)
Calcium: 9.8 mg/dL (ref 8.6–10.2)
Chloride: 98 mmol/L (ref 96–106)
Creatinine, Ser: 1.1 mg/dL (ref 0.76–1.27)
Globulin, Total: 3 g/dL (ref 1.5–4.5)
Glucose: 125 mg/dL — ABNORMAL HIGH (ref 65–99)
Potassium: 3.7 mmol/L (ref 3.5–5.2)
Sodium: 138 mmol/L (ref 134–144)
Total Protein: 7.5 g/dL (ref 6.0–8.5)
eGFR: 72 mL/min/{1.73_m2} (ref >59)

## 2020-05-27 LAB — CBC WITH DIFFERENTIAL/PLATELET
Basophils Absolute: 0.1 10*3/uL (ref 0.0–0.2)
Basos: 1 %
EOS (ABSOLUTE): 0.2 10*3/uL (ref 0.0–0.4)
Eos: 3 %
Hematocrit: 45.4 % (ref 37.5–51.0)
Hemoglobin: 15.8 g/dL (ref 13.0–17.7)
Immature Grans (Abs): 0 10*3/uL (ref 0.0–0.1)
Immature Granulocytes: 0 %
Lymphocytes Absolute: 2.2 10*3/uL (ref 0.7–3.1)
Lymphs: 32 %
MCH: 33.7 pg — ABNORMAL HIGH (ref 26.6–33.0)
MCHC: 34.8 g/dL (ref 31.5–35.7)
MCV: 97 fL (ref 79–97)
Monocytes Absolute: 0.8 10*3/uL (ref 0.1–0.9)
Monocytes: 12 %
Neutrophils Absolute: 3.7 10*3/uL (ref 1.4–7.0)
Neutrophils: 52 %
Platelets: 208 10*3/uL (ref 150–450)
RBC: 4.69 x10E6/uL (ref 4.14–5.80)
RDW: 12.9 % (ref 11.6–15.4)
WBC: 6.9 10*3/uL (ref 3.4–10.8)

## 2020-05-27 LAB — LIPID PANEL
Chol/HDL Ratio: 3.5 ratio (ref 0.0–5.0)
Cholesterol, Total: 171 mg/dL (ref 100–199)
HDL: 49 mg/dL (ref >39)
LDL Chol Calc (NIH): 96 mg/dL (ref 0–99)
Triglycerides: 149 mg/dL (ref 0–149)
VLDL Cholesterol Cal: 26 mg/dL (ref 5–40)

## 2020-05-27 LAB — PSA: Prostate Specific Ag, Serum: 2.8 ng/mL (ref 0.0–4.0)

## 2020-05-28 LAB — SPECIMEN STATUS REPORT

## 2020-05-28 LAB — HGB A1C W/O EAG: Hgb A1c MFr Bld: 5.6 % (ref 4.8–5.6)

## 2020-07-07 IMAGING — CT CT CHEST W/O CM
1 series · 15 of 34 positions shown, 19 images · non-contrast
Comparison: 09/07/2015

CLINICAL DATA: Progressive shortness of breath, congestion, cough,
COPD.

EXAM:
CT CHEST WITHOUT CONTRAST
TECHNIQUE: Multidetector CT imaging of the chest was performed following the
standard protocol without IV contrast.

[Series 2: thorax · axial · 0.71mm/px · z∈[-339,-75]mm · 15 of 156 slices shown, 19 images]
[im 12/156  mediastinal]
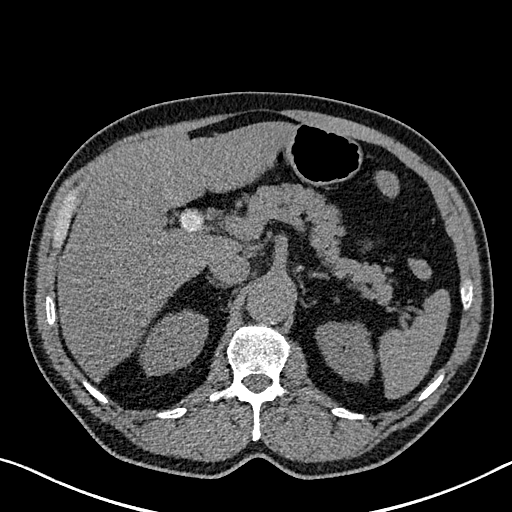
[im 12/156  lung]
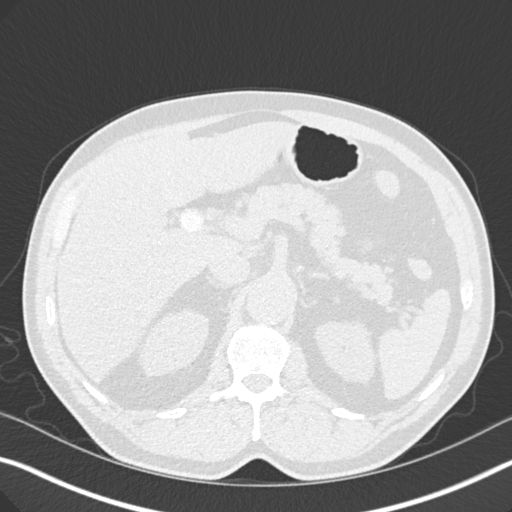
[im 23/156  lung]
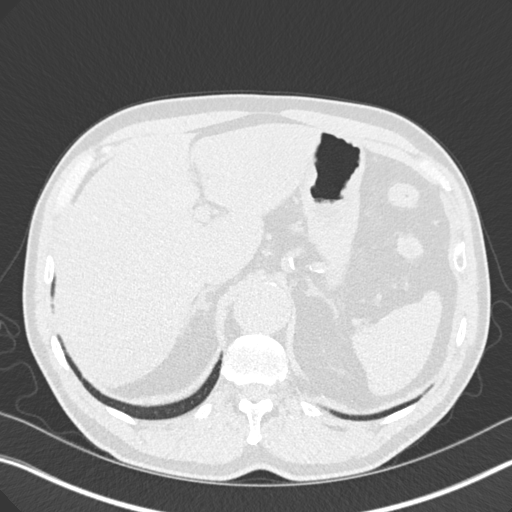
[im 32/156  lung]
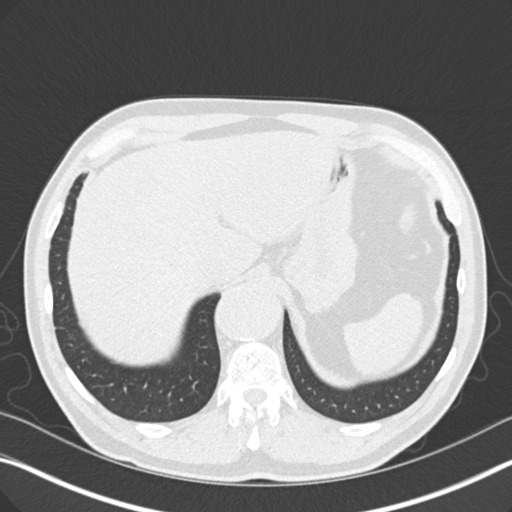
[im 41/156  lung]
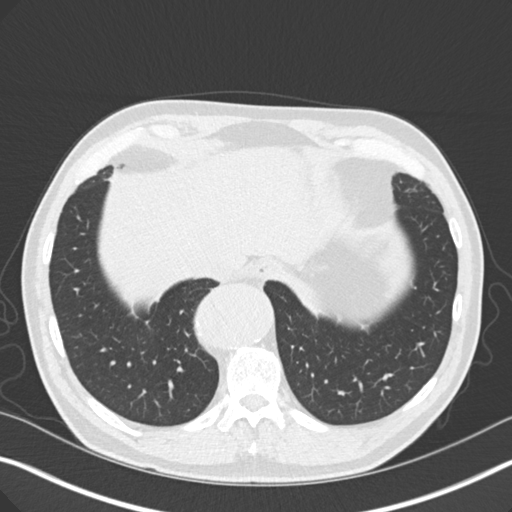
[im 52/156  mediastinal]
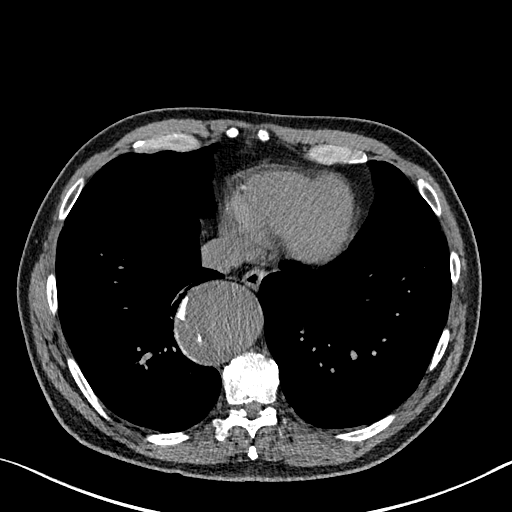
[im 52/156  lung]
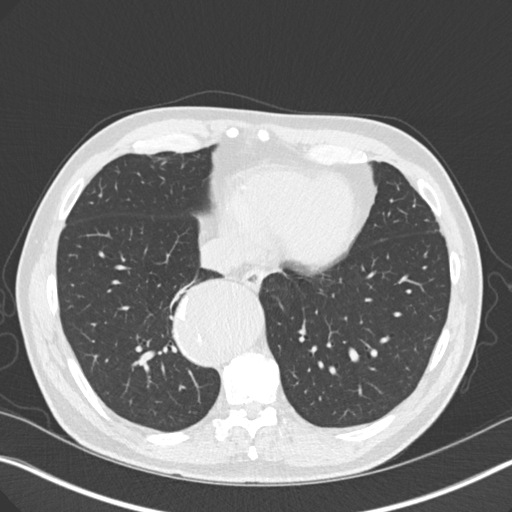
[im 63/156  lung]
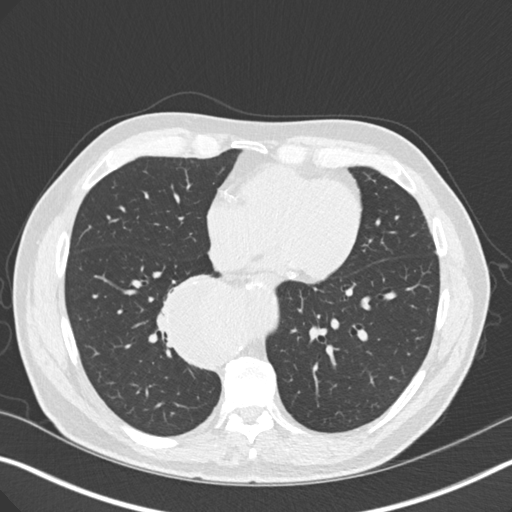
[im 69/156  lung]
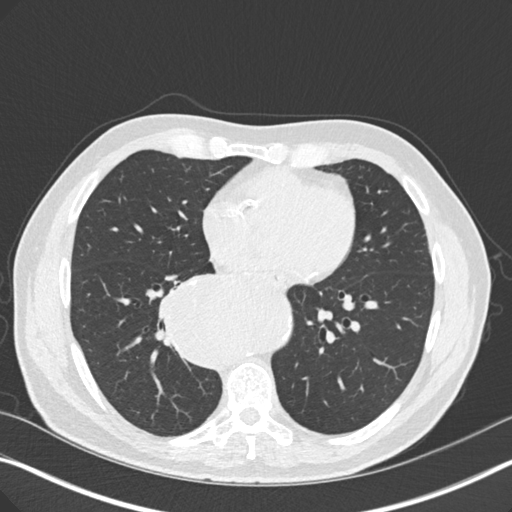
[im 81/156  lung]
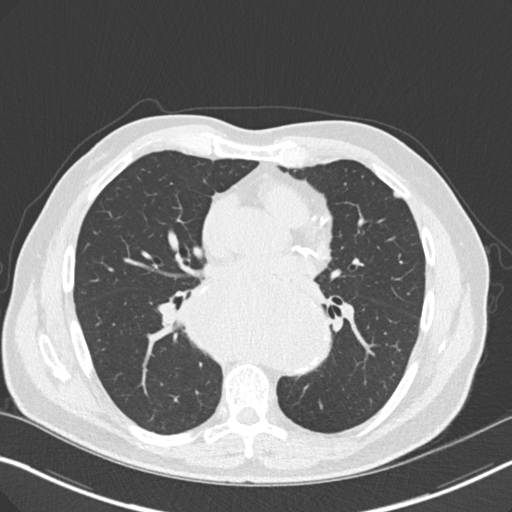
[im 87/156  mediastinal]
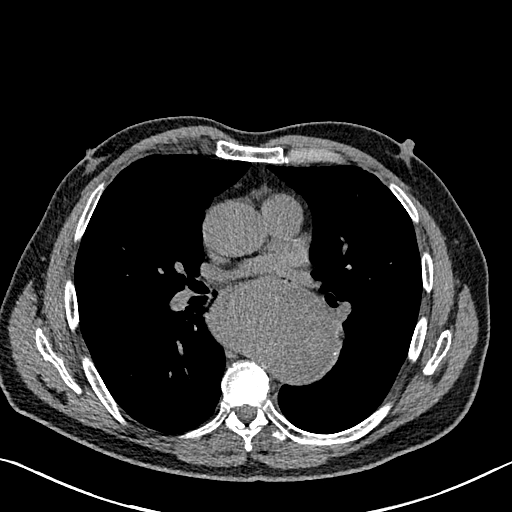
[im 87/156  lung]
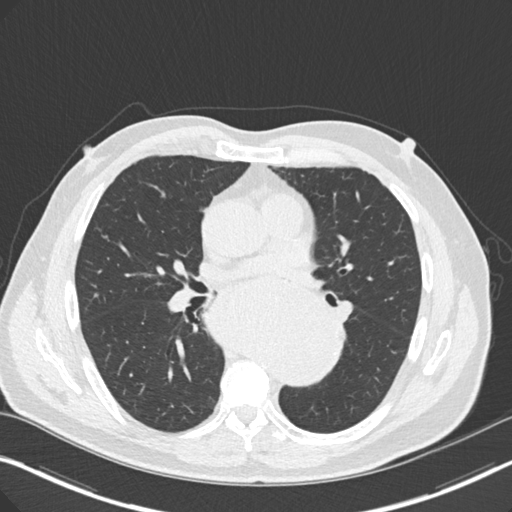
[im 94/156  lung]
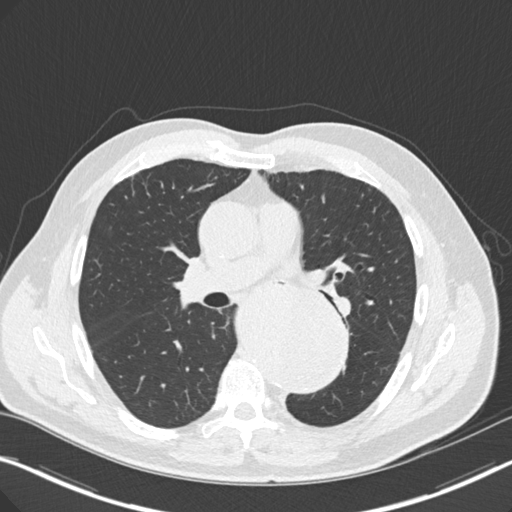
[im 104/156  lung]
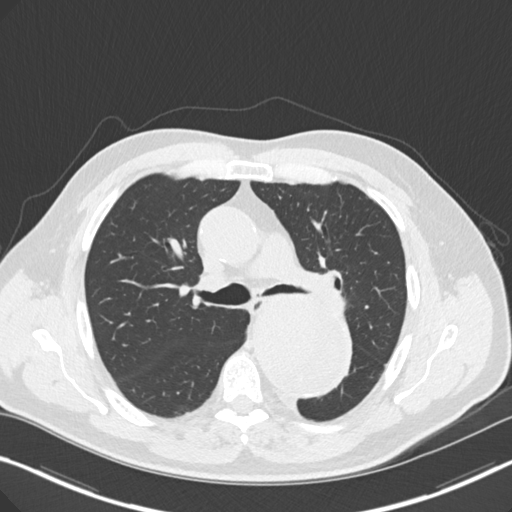
[im 115/156  lung]
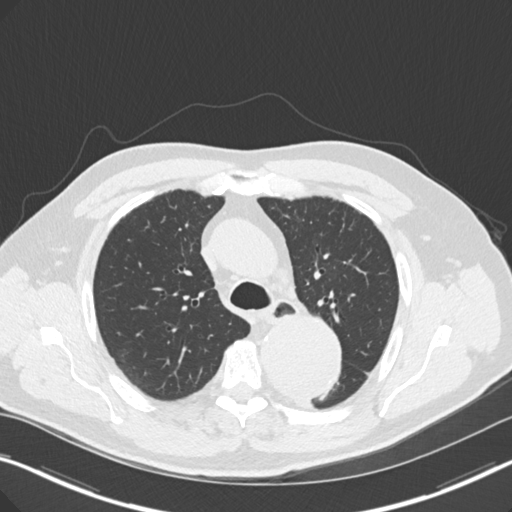
[im 125/156  mediastinal]
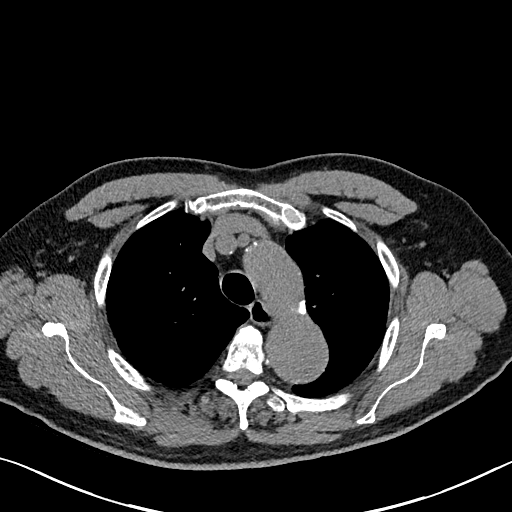
[im 125/156  lung]
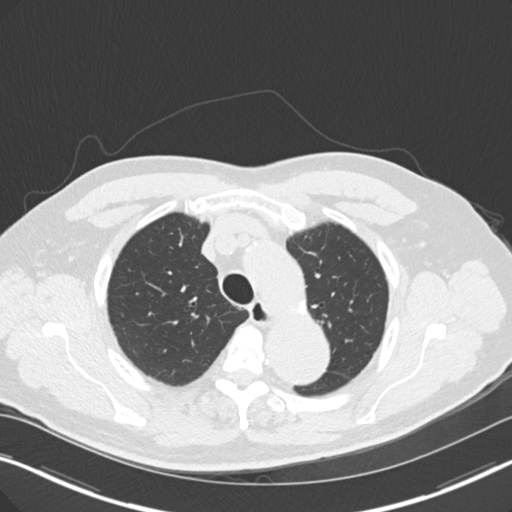
[im 133/156  lung]
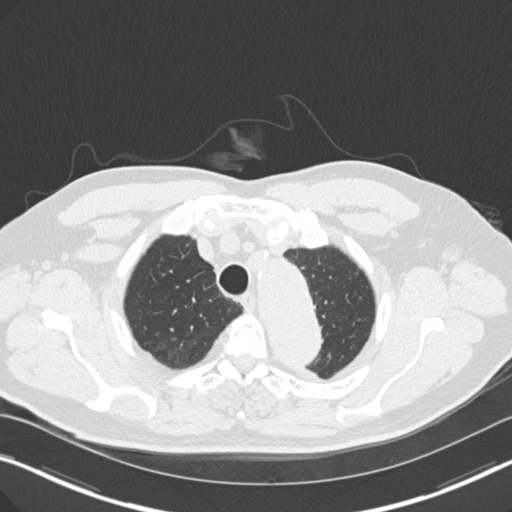
[im 144/156  lung]
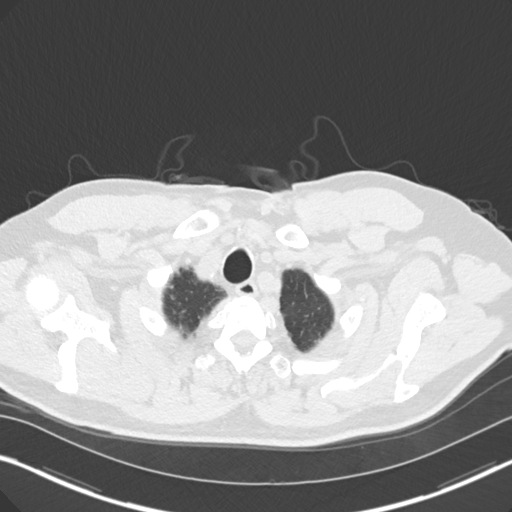

[15 of 34 positions shown; findings below may reference images not displayed]

FINDINGS: Cardiovascular: Heart size normal. No significant pericardial
effusion. Central pulmonary arteries normal in caliber.

Moderate coronary calcifications.

Thoracic aortic aneurysm measuring at least 4.1 cm in the ascending
segment, 3.7 cm distal arch, 7 cm mid descending at the level of the
left mainstem bronchus (previously 6 cm), tapering to 4 cm above the
diaphragm. Descending segment is tortuous. There is evidence of
partially calcified mural thrombus in the aneurysmal segments as
before. No evidence of rupture.

Mediastinum/Nodes: No hilar or mediastinal adenopathy.

Lungs/Pleura: No pleural effusion. No pneumothorax. Minimal linear
scarring or subsegmental atelectasis medially in the right lower
lobe. Small subpleural blebs in the lung apices. No focal infiltrate
or nodule.

Upper Abdomen: Multiple partially calcified gallstones measuring up
to 1.9 cm, without evidence of biliary obstruction. No acute
findings.

Musculoskeletal: No chest wall mass or suspicious bone lesions
identified.
IMPRESSION: 1. No acute pulmonary findings.
2. Enlarging thoracic aortic aneurysm (7BIQH-644.Y) up to 7 cm
diameter (previous 6 cm) without rupture. Cardiothoracic surgery
consultation recommended due to increased risk of rupture for arch
aneurysm ? 5.5 cm. This recommendation follows 8909
ACCF/AHA/AATS/ACR/ASA/SCA/TIGER/AZALANNIE/ABDLKADAR/MALTESE Guidelines for the
Diagnosis and Management of Patients With Thoracic Aortic Disease.
Circulation. 8909; 121: [id number]-e369. Aortic aneurysm NOS (7BIQH-644.Y)
3. Coronary and Aortic Atherosclerosis (7BIQH-170.0).
4. Cholelithiasis

## 2020-11-29 ENCOUNTER — Ambulatory Visit: Payer: Medicare HMO | Admitting: Internal Medicine

## 2021-05-31 ENCOUNTER — Encounter: Payer: Self-pay | Admitting: Internal Medicine

## 2021-05-31 ENCOUNTER — Other Ambulatory Visit: Payer: Self-pay

## 2021-05-31 ENCOUNTER — Ambulatory Visit (INDEPENDENT_AMBULATORY_CARE_PROVIDER_SITE_OTHER): Payer: Medicare HMO | Admitting: Internal Medicine

## 2021-05-31 VITALS — BP 118/80 | HR 90 | Ht 66.0 in | Wt 158.0 lb

## 2021-05-31 DIAGNOSIS — Z1211 Encounter for screening for malignant neoplasm of colon: Secondary | ICD-10-CM | POA: Diagnosis not present

## 2021-05-31 DIAGNOSIS — Z0000 Encounter for general adult medical examination without abnormal findings: Secondary | ICD-10-CM | POA: Diagnosis not present

## 2021-05-31 DIAGNOSIS — E782 Mixed hyperlipidemia: Secondary | ICD-10-CM

## 2021-05-31 DIAGNOSIS — R972 Elevated prostate specific antigen [PSA]: Secondary | ICD-10-CM

## 2021-05-31 DIAGNOSIS — R7301 Impaired fasting glucose: Secondary | ICD-10-CM | POA: Diagnosis not present

## 2021-05-31 DIAGNOSIS — K219 Gastro-esophageal reflux disease without esophagitis: Secondary | ICD-10-CM | POA: Diagnosis not present

## 2021-05-31 DIAGNOSIS — J439 Emphysema, unspecified: Secondary | ICD-10-CM | POA: Diagnosis not present

## 2021-05-31 DIAGNOSIS — I714 Abdominal aortic aneurysm, without rupture, unspecified: Secondary | ICD-10-CM | POA: Diagnosis not present

## 2021-05-31 DIAGNOSIS — I10 Essential (primary) hypertension: Secondary | ICD-10-CM | POA: Diagnosis not present

## 2021-05-31 DIAGNOSIS — K8020 Calculus of gallbladder without cholecystitis without obstruction: Secondary | ICD-10-CM

## 2021-05-31 LAB — POCT URINALYSIS DIPSTICK
Bilirubin, UA: NEGATIVE
Blood, UA: NEGATIVE
Glucose, UA: NEGATIVE
Ketones, UA: NEGATIVE
Leukocytes, UA: NEGATIVE
Nitrite, UA: NEGATIVE
Protein, UA: NEGATIVE
Spec Grav, UA: 1.01 (ref 1.010–1.025)
Urobilinogen, UA: 0.2 E.U./dL
pH, UA: 6 (ref 5.0–8.0)

## 2021-05-31 MED ORDER — LOSARTAN POTASSIUM 100 MG PO TABS: 100.0000 mg | ORAL_TABLET | Freq: Every day | ORAL | 3 refills | Status: DC

## 2021-05-31 MED ORDER — OMEPRAZOLE 20 MG PO CPDR: 20.0000 mg | DELAYED_RELEASE_CAPSULE | Freq: Two times a day (BID) | ORAL | 3 refills | Status: DC

## 2021-05-31 MED ORDER — HYDROCHLOROTHIAZIDE 25 MG PO TABS: 25.0000 mg | ORAL_TABLET | Freq: Every day | ORAL | 3 refills | Status: DC

## 2021-05-31 MED ORDER — ATORVASTATIN CALCIUM 10 MG PO TABS: 10.0000 mg | ORAL_TABLET | Freq: Every day | ORAL | 3 refills | Status: DC

## 2021-05-31 MED ORDER — CARVEDILOL 6.25 MG PO TABS: 6.2500 mg | ORAL_TABLET | Freq: Two times a day (BID) | ORAL | 3 refills | Status: DC

## 2021-05-31 MED ORDER — AMLODIPINE BESYLATE 5 MG PO TABS: 5.0000 mg | ORAL_TABLET | Freq: Every day | ORAL | 3 refills | Status: DC

## 2021-05-31 MED ORDER — ALBUTEROL SULFATE HFA 108 (90 BASE) MCG/ACT IN AERS: 2.0000 | INHALATION_SPRAY | Freq: Four times a day (QID) | RESPIRATORY_TRACT | 0 refills | Status: DC | PRN

## 2021-05-31 NOTE — Progress Notes (Signed)
? ? ?Date:  05/31/2021  ? ?Name:  Trevor Werner   DOB:  [birth year]   MRN:  [id number] ? ? ?Chief Complaint: Annual Exam and Anorexia (Loss of appetite, with severe acid reflux. Patient does have gall stones and feels this is the cause. Acid reflux gets worse with BP medication. Pain located around gall bladder. ) ?Trevor Werner is a 71 y.o. male who presents today for his Complete Annual Exam. He feels poorly. He reports exercising - not at this time. He reports he is sleeping fairly well- acid reflux causes sleeping issues.  ? ?Colonoscopy: 01/2010 ? ?Immunization History  ?Administered Date(s) Administered  ? Fluad Quad(high Dose 65+) 12/05/2019  ? Influenza Whole 12/07/2009  ? Influenza, High Dose Seasonal PF 12/24/2017  ? Influenza, Quadrivalent, Recombinant, Inj, Pf 12/17/2018  ? Influenza-Unspecified 12/24/2017, 12/17/2018, 12/29/2020  ? PFIZER Comirnaty(Gray Top)Covid-19 Tri-Sucrose Vaccine 05/22/2019, 06/12/2019  ? PFIZER(Purple Top)SARS-COV-2 Vaccination 05/22/2019, 12/15/2019  ? Ambulance person Booster 5y-11y 12/29/2020  ? Pneumococcal Conjugate-13 05/17/2015  ? Pneumococcal Polysaccharide-23 05/17/2016  ? Td 12/07/2009  ? ? ?Lab Results  ?Component Value Date  ? PSA1 2.8 05/26/2020  ? PSA1 2.6 05/26/2019  ? PSA1 3.5 05/22/2018  ? PSA 3.1 07/02/2012  ? PSA 1.93 12/09/2009  ? ? ? ?Hypertension ?This is a chronic problem. The problem is controlled. Pertinent negatives include no chest pain, headaches, palpitations or shortness of breath. Past treatments include calcium channel blockers, beta blockers, angiotensin blockers and diuretics. The current treatment provides significant improvement. Hypertensive end-organ damage includes PVD (aortic aneurysm).  ?Hyperlipidemia ?This is a chronic problem. The problem is controlled. Pertinent negatives include no chest pain, myalgias or shortness of breath. Current antihyperlipidemic treatment includes statins.  ?Gastroesophageal Reflux ?He complains of  abdominal pain, choking, coughing, heartburn, water brash and wheezing. He reports no chest pain or no nausea. This is a recurrent problem. The current episode started more than 1 month ago. The problem occurs frequently. The problem has been gradually worsening. The heartburn duration is several minutes. The heartburn is located in the RUQ. The heartburn is of moderate intensity. The heartburn wakes him from sleep. The heartburn does not limit his activity. The heartburn doesn't change with position. The symptoms are aggravated by medications, certain foods and lying down. Pertinent negatives include no fatigue. Risk factors: known gall stones.  ?COPD - he has a chronic cough that is loose but non productive.  He has not had much benefit from multiple inhalers.  He continues to use albuterol when he has wheezing.  He feels like his lower right lung is congestion most of the time - he attributes this to the gall stones. ? ?Lab Results  ?Component Value Date  ? NA 138 05/26/2020  ? K 3.7 05/26/2020  ? CO2 24 05/26/2020  ? GLUCOSE 125 (H) 05/26/2020  ? BUN 13 05/26/2020  ? CREATININE 1.10 05/26/2020  ? CALCIUM 9.8 05/26/2020  ? EGFR 72 05/26/2020  ? GFRNONAA 65 05/26/2019  ? ?Lab Results  ?Component Value Date  ? CHOL 171 05/26/2020  ? HDL 49 05/26/2020  ? [place] 96 05/26/2020  ? LDLDIRECT 164.8 12/09/2009  ? TRIG 149 05/26/2020  ? CHOLHDL 3.5 05/26/2020  ? ?Lab Results  ?Component Value Date  ? TSH 2.580 05/17/2015  ? ?Lab Results  ?Component Value Date  ? HGBA1C 5.6 05/26/2020  ? ?Lab Results  ?Component Value Date  ? WBC 6.9 05/26/2020  ? HGB 15.8 05/26/2020  ? HCT 45.4 05/26/2020  ? MCV  97 05/26/2020  ? PLT 208 05/26/2020  ? ?Lab Results  ?Component Value Date  ? ALT 18 05/26/2020  ? AST 23 05/26/2020  ? ALKPHOS 79 05/26/2020  ? BILITOT 1.5 (H) 05/26/2020  ? ?No results found for: 25OHVITD2, [place], VD25OH  ? ?Review of Systems  ?Constitutional:  Negative for appetite change, chills, diaphoresis, fatigue and  unexpected weight change.  ?HENT:  Negative for hearing loss, tinnitus, trouble swallowing and voice change.   ?Eyes:  Negative for visual disturbance.  ?Respiratory:  Positive for cough, choking and wheezing. Negative for shortness of breath.   ?Cardiovascular:  Negative for chest pain, palpitations and leg swelling.  ?Gastrointestinal:  Positive for abdominal pain and heartburn. Negative for blood in stool, constipation, diarrhea and nausea.  ?Genitourinary:  Negative for difficulty urinating, dysuria and frequency.  ?Musculoskeletal:  Negative for arthralgias, back pain and myalgias.  ?Skin:  Negative for color change and rash.  ?Neurological:  Negative for dizziness, syncope and headaches.  ?Hematological:  Negative for adenopathy.  ?Psychiatric/Behavioral:  Negative for dysphoric mood and sleep disturbance. The patient is not nervous/anxious.   ? ?Patient Active Problem List  ? Diagnosis Date Noted  ? Chronic obstructive pulmonary disease ([place]) 05/26/2019  ? Fasting hyperglycemia 06/14/2018  ? Rising PSA level 05/18/2016  ? CAD (coronary artery disease) 11/01/2015  ? Pulmonary nodule, right 05/17/2015  ? Edema leg 02/02/2015  ? AAA (abdominal aortic aneurysm) without rupture 07/28/2014  ? Benign essential HTN 07/28/2014  ? Mixed hyperlipidemia 07/28/2014  ? Reflux esophagitis 03/28/2010  ? 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 ? ?No Known Allergies ? ?Past Surgical History:  ?Procedure Laterality Date  ? CATARACT EXTRACTION Bilateral   ? COLONOSCOPY  2011  ? HAND SURGERY Right 1974  ? RHINOPLASTY    ? ? ?Social History  ? ?Tobacco Use  ? Smoking status: Former  ?  Packs/day: 2.00  ?  Years: 15.00  ?  Pack years: 30.00  ?  Types: Cigarettes  ?  Quit date: 03/20/1978  ?  Years since quitting: 43.2  ? Smokeless tobacco: Never  ? Tobacco comments:  ?  smoking cessation materials not required  ?Vaping Use  ? Vaping Use: Never used  ?Substance Use Topics  ? Alcohol use: Yes  ?  Alcohol/week: 22.0 standard drinks  ?  Types:  10 Shots of liquor, 12 Cans of beer per week  ? Drug use: No  ? ? ? ?Medication list has been reviewed and updated. ? ?Current Meds  ?Medication Sig  ? aspirin EC 81 MG tablet Take 1 tablet by mouth daily.  ? omeprazole (PRILOSEC) 20 MG capsule Take 1 capsule (20 mg total) by mouth 2 (two) times daily before a meal.  ? [DISCONTINUED] albuterol (VENTOLIN HFA) 108 (90 Base) MCG/ACT inhaler Inhale 2 puffs into the lungs every 6 (six) hours as needed for wheezing or shortness of breath.  ? [DISCONTINUED] amLODipine (NORVASC) 5 MG tablet Take 1 tablet (5 mg total) by mouth daily.  ? [DISCONTINUED] atorvastatin (LIPITOR) 10 MG tablet Take 1 tablet (10 mg total) by mouth at bedtime.  ? [DISCONTINUED] carvedilol (COREG) 6.25 MG tablet Take 1 tablet (6.25 mg total) by mouth 2 (two) times daily.  ? [DISCONTINUED] hydrochlorothiazide (HYDRODIURIL) 25 MG tablet Take 1 tablet (25 mg total) by mouth daily.  ? [DISCONTINUED] losartan (COZAAR) 100 MG tablet Take 1 tablet (100 mg total) by mouth daily.  ? ? ?PHQ 2/9 Scores 05/31/2021 05/26/2020 05/26/2019 01/20/2019  ?PHQ - 2 Score 0  0 - 0  ?PHQ- 9 Score 0 0 - 5  ?Exception Documentation - - Patient refusal -  ? ? ?GAD 7 : Generalized Anxiety Score 05/31/2021 05/26/2020  ?Nervous, Anxious, on Edge 0 0  ?Control/stop worrying 0 0  ?Worry too much - different things 0 0  ?Trouble relaxing 0 0  ?Restless 0 0  ?Easily annoyed or irritable 0 0  ?Afraid - awful might happen 0 0  ?Total GAD 7 Score 0 0  ?Anxiety Difficulty Not difficult at all -  ? ? ?BP Readings from Last 3 Encounters:  ?05/31/21 118/80  ?05/26/20 126/88  ?05/26/19 124/78  ? ? ?Physical Exam ?Vitals and nursing note reviewed.  ?Constitutional:   ?   Appearance: Normal appearance. He is well-developed.  ?HENT:  ?   Head: Normocephalic.  ?   Right Ear: Tympanic membrane, ear canal and external ear normal.  ?   Left Ear: Tympanic membrane, ear canal and external ear normal.  ?   Nose: Nose normal.  ?Eyes:  ?   Conjunctiva/sclera:  Conjunctivae normal.  ?   Pupils: Pupils are equal, round, and reactive to light.  ?Neck:  ?   Thyroid: No thyromegaly.  ?   Vascular: No carotid bruit.  ?Cardiovascular:  ?   Rate and Rhythm: Normal rate

## 2021-06-01 ENCOUNTER — Other Ambulatory Visit: Payer: Self-pay | Admitting: Internal Medicine

## 2021-06-01 DIAGNOSIS — K8020 Calculus of gallbladder without cholecystitis without obstruction: Secondary | ICD-10-CM

## 2021-06-01 LAB — CBC WITH DIFFERENTIAL/PLATELET
Basophils Absolute: 0.1 10*3/uL (ref 0.0–0.2)
Basos: 1 %
EOS (ABSOLUTE): 0.1 10*3/uL (ref 0.0–0.4)
Eos: 1 %
Hematocrit: 48.1 % (ref 37.5–51.0)
Hemoglobin: 16.6 g/dL (ref 13.0–17.7)
Immature Grans (Abs): 0.1 10*3/uL (ref 0.0–0.1)
Immature Granulocytes: 1 %
Lymphocytes Absolute: 1.5 10*3/uL (ref 0.7–3.1)
Lymphs: 22 %
MCH: 33 pg (ref 26.6–33.0)
MCHC: 34.5 g/dL (ref 31.5–35.7)
MCV: 96 fL (ref 79–97)
Monocytes Absolute: 0.9 10*3/uL (ref 0.1–0.9)
Monocytes: 13 %
Neutrophils Absolute: 4.4 10*3/uL (ref 1.4–7.0)
Neutrophils: 62 %
Platelets: 187 10*3/uL (ref 150–450)
RBC: 5.03 x10E6/uL (ref 4.14–5.80)
RDW: 12.5 % (ref 11.6–15.4)
WBC: 6.9 10*3/uL (ref 3.4–10.8)

## 2021-06-01 LAB — LIPID PANEL
Chol/HDL Ratio: 2.9 ratio (ref 0.0–5.0)
Cholesterol, Total: 167 mg/dL (ref 100–199)
HDL: 57 mg/dL (ref >39)
LDL Chol Calc (NIH): 92 mg/dL (ref 0–99)
Triglycerides: 97 mg/dL (ref 0–149)
VLDL Cholesterol Cal: 18 mg/dL (ref 5–40)

## 2021-06-01 LAB — HEMOGLOBIN A1C
Est. average glucose Bld gHb Est-mCnc: 108 mg/dL
Hgb A1c MFr Bld: 5.4 % (ref 4.8–5.6)

## 2021-06-01 LAB — COMPREHENSIVE METABOLIC PANEL
ALT: 18 IU/L (ref 0–44)
AST: 25 IU/L (ref 0–40)
Albumin/Globulin Ratio: 1.4 (ref 1.2–2.2)
Albumin: 4.6 g/dL (ref 3.7–4.7)
Alkaline Phosphatase: 87 IU/L (ref 44–121)
BUN/Creatinine Ratio: 10 (ref 10–24)
BUN: 12 mg/dL (ref 8–27)
Bilirubin Total: 1.9 mg/dL — ABNORMAL HIGH (ref 0.0–1.2)
CO2: 25 mmol/L (ref 20–29)
Calcium: 10.1 mg/dL (ref 8.6–10.2)
Chloride: 98 mmol/L (ref 96–106)
Creatinine, Ser: 1.16 mg/dL (ref 0.76–1.27)
Globulin, Total: 3.4 g/dL (ref 1.5–4.5)
Glucose: 135 mg/dL — ABNORMAL HIGH (ref 70–99)
Potassium: 4.4 mmol/L (ref 3.5–5.2)
Sodium: 137 mmol/L (ref 134–144)
Total Protein: 8 g/dL (ref 6.0–8.5)
eGFR: 67 mL/min/{1.73_m2} (ref >59)

## 2021-06-01 LAB — PSA: Prostate Specific Ag, Serum: 3.4 ng/mL (ref 0.0–4.0)

## 2021-06-03 LAB — H. PYLORI BREATH TEST: H pylori Breath Test: NEGATIVE

## 2021-06-06 ENCOUNTER — Other Ambulatory Visit: Payer: Self-pay

## 2021-06-06 ENCOUNTER — Encounter: Payer: Self-pay | Admitting: Surgery

## 2021-06-06 ENCOUNTER — Ambulatory Visit (INDEPENDENT_AMBULATORY_CARE_PROVIDER_SITE_OTHER): Payer: Medicare HMO | Admitting: Surgery

## 2021-06-06 VITALS — BP 141/94 | HR 79 | Temp 99.0°F | Ht 66.0 in | Wt 150.2 lb

## 2021-06-06 DIAGNOSIS — K8020 Calculus of gallbladder without cholecystitis without obstruction: Secondary | ICD-10-CM

## 2021-06-06 DIAGNOSIS — K219 Gastro-esophageal reflux disease without esophagitis: Secondary | ICD-10-CM

## 2021-06-06 NOTE — Patient Instructions (Addendum)
Continue your Omeprazole twice a day. ? ?Follow up with Dr Pattricia Boss to look at your aneurysm to make sure it has not gotten bigger. You may need to have Dr Doug Sou office set this up for you.  ? ? ?Follow up here in 1 month for reevaluation.  ?

## 2021-06-06 NOTE — Progress Notes (Signed)
?06/06/2021 ? ?Reason for Visit:  Cholelithiasis ? ?Requesting Provider:  Bari Edward, MD ? ?History of Present Illness: ?Trevor Werner is a 71 y.o. male presenting for evaluation of cholelithiasis.  Patient reports that he has a long history of cholelithiasis for many years but over the past few months he has noticed his symptoms have been worsening.  He reports that the symptoms that he gets are pain in the right upper quadrant and epigastric area.  He also gets heartburn with sensation of burning and reflux going up his chest or his throat.  He will have episodes where he can taste the bitterness of the acid and feels that sometimes it may go towards his lung.  Sometimes the pain also radiates towards his back.  He feels that all the symptoms may happen at similar times.  Denies any fevers, chills, chest pain, shortness of breath, nausea, vomiting, constipation, diarrhea.  Denies any lower abdominal pain.  He reports that his PCP has recently started him on Prilosec 20 mg twice daily about 1 week ago, and he feels that both types of symptoms have improved with the heartburn and reflux as well as the pain in the right upper quadrant.  None of these have gone away yet but he feels that they have improved. ? ?Of note the patient has a history very significant for thoracic aortic aneurysm and had been followed by Dr. Pattricia Boss at [facility] in the past.  He last saw him on 06/08/2016 and CT scan chest abdomen and pelvis with contrast on that date showed mildly increased size of the thoracic aspect of the type B thoracoabdominal aortic aneurysm, measuring then 7.1 cm.  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  Dr. Pattricia Boss had talked with him about the different surgical options including endovascular repair and open repair.  The patient elected not to have any surgery at the time but then has not followed up with him since 2018.  Also  of note, the patient has not had dedicated ultrasound of the right upper quadrant to look at the gallbladder itself but we do have evidence of cholelithiasis on the CT scans that he has had on follow-up of his aneurysm. ? ?Past Medical History: ?Past Medical History:  ?Diagnosis Date  ? Allergy   ? Aneurysm of aorta (HCC)   ? Atherosclerotic cardiovascular disease   ? GERD (gastroesophageal reflux disease)   ? Heartburn   ? HLD (hyperlipidemia)   ? Hypertension   ? Localized edema   ? Tinnitus   ?  ? ?Past Surgical History: ?Past Surgical History:  ?Procedure Laterality Date  ? CATARACT EXTRACTION Bilateral   ? COLONOSCOPY  2011  ? HAND SURGERY Right 1974  ? RHINOPLASTY    ? ? ?Home Medications: ?Prior to Admission medications   ?Medication Sig Start Date End Date Taking? Authorizing Provider  ?albuterol (VENTOLIN HFA) 108 (90 Base) MCG/ACT inhaler Inhale 2 puffs into the lungs every 6 (six) hours as needed for wheezing or shortness of breath. 05/31/21  Yes Reubin Milan, MD  ?amLODipine (NORVASC) 5 MG tablet Take 1 tablet (5 mg total) by mouth daily. 05/31/21  Yes Reubin Milan, MD  ?aspirin EC 81 MG tablet Take 1 tablet by mouth daily.   Yes [provider]  ?atorvastatin (LIPITOR) 10 MG tablet Take 1 tablet (10 mg total) by mouth at bedtime. 05/31/21  Yes Reubin Milan, MD  ?carvedilol (COREG)  6.25 MG tablet Take 1 tablet (6.25 mg total) by mouth 2 (two) times daily. 05/31/21  Yes Reubin MilanBerglund, Laura H, MD  ?hydrochlorothiazide (HYDRODIURIL) 25 MG tablet Take 1 tablet (25 mg total) by mouth daily. 05/31/21  Yes Reubin MilanBerglund, Laura H, MD  ?losartan (COZAAR) 100 MG tablet Take 1 tablet (100 mg total) by mouth daily. 05/31/21  Yes Reubin MilanBerglund, Laura H, MD  ?omeprazole (PRILOSEC) 20 MG capsule Take 1 capsule (20 mg total) by mouth 2 (two) times daily before a meal. 05/31/21  Yes Reubin MilanBerglund, Laura H, MD  ? ? ?Allergies: ?No Known Allergies ? ?Social History: ? reports that he quit smoking about 43 years ago. His  smoking use included cigarettes. He has a 30.00 pack-year smoking history. He has never used smokeless tobacco. He reports current alcohol use of about 22.0 standard drinks per week. He reports that he does not use drugs.  ? ?Family History: ?Family History  ?Problem Relation Age of Onset  ? Stroke Father   ? Heart failure Mother   ? Heart failure Brother   ? Heart block Sister   ? Prostate cancer Neg Hx   ? Kidney cancer Neg Hx   ? Bladder Cancer Neg Hx   ? ? ?Review of Systems: ?Review of Systems  ?Constitutional:  Negative for chills and fever.  ?HENT:  Negative for hearing loss.   ?Respiratory:  Negative for shortness of breath.   ?Cardiovascular:  Negative for chest pain.  ?Gastrointestinal:  Positive for abdominal pain and heartburn. Negative for constipation, diarrhea, nausea and vomiting.  ?Genitourinary:  Negative for dysuria.  ?Musculoskeletal:  Positive for back pain. Negative for myalgias.  ?Skin:  Negative for rash.  ?Neurological:  Negative for dizziness.  ?Psychiatric/Behavioral:  Negative for depression.   ? ?Physical Exam ?BP (!) 141/94   Pulse 79   Temp 99 ?F (37.2 ?C)   Ht 5\' 6"  (1.676 m)   Wt 150 lb 3.2 oz (68.1 kg)   SpO2 93%   BMI 24.24 kg/m?  ?CONSTITUTIONAL: No acute distress, well-nourished ?HEENT:  Normocephalic, atraumatic, extraocular motion intact. ?NECK: Trachea is midline, and there is no jugular venous distension.  ?RESPIRATORY:  Lungs are clear, and breath sounds are equal bilaterally. Normal respiratory effort without pathologic use of accessory muscles. ?CARDIOVASCULAR: Heart is regular without murmurs, gallops, or rubs. ?GI: The abdomen is soft, nondistended, with some discomfort in the epigastric and right upper quadrant areas.  Negative Murphy's sign.Marland Kitchen.  ?MUSCULOSKELETAL:  Normal muscle strength and tone in all four extremities.  No peripheral edema or cyanosis. ?SKIN: Skin turgor is normal. There are no pathologic skin lesions.  ?NEUROLOGIC:  Motor and sensation is grossly  normal.  Cranial nerves are grossly intact. ?PSYCH:  Alert and oriented to person, place and time. Affect is normal. ? ?Laboratory Analysis: ?Labs from 05/31/2021: ?Sodium 137, potassium 4.4, chloride 98, CO2 25, BUN 12, creatinine 1.16.  Total bilirubin 1.9, AST 25, ALT 18, alkaline phosphatase 87, albumin 4.6.  WBC 6.9, hemoglobin 16.6, hematocrit 48.1, platelets 187. ? ?Imaging: ?CT Endo C/A/P 06/08/2016: ?VASCULAR FINDINGS:  ?Redemonstration of type B thoracolumbar abdominal aortic aneurysm. Largest transverse diameter of the aneurysm is 7.2 x 5.7 cm, previously up to 6.9 cm when remeasured. Similar appearance of extensive thrombosed false lumen extending to the distal thoracic aorta. Aneurysmal dilatation of the suprarenal abdominal aorta measuring 3.5 x 3.0 cm, unchanged. 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 Normal caliber ascending aorta.  ? ?Normal heart size. Compression of  the left atrium by the aortic aneurysm. 3 vessel coronary artery calcifications. No pericardial effusion. Normal caliber pulmonary arteries. Normal three-vessel arch.  ? ?The celiac, SMA, and IMA are patent. Bilateral main and bilateral accessory renal arteries are patent. Conventional venous drainage of the kidneys.  ? ?Pelvic inflow and outflow vessels are patent without stenosis and with mild atherosclerotic disease. Tortuous bilateral common iliac arteries measuring up to 1.9 cm on the right, unchanged.  ? ?NONVASCULAR FINDINGS:  ?No adenopathy in the chest. Central airways are clear without endobronchial lesion. Lungs are clear without consolidation or nodule. No pulmonary effusion.  Left fat-containing Bochdalek hernia.  ? ?The liver, spleen, adrenal glands, pancreas are unremarkable. Cholelithiasis without cholecystitis. No biliary dilatation. Symmetric nephrograms without hydronephrosis or calculi. Large and small bowel are unremarkable without obstruction or inflammation. Normal appendix.   ? ?Unremarkable bladder. Coarse calcifications in the mildly enlarged prostate measuring 5.2 cm.  ? ?No free fluid. No free air. No abdominopelvic adenopathy.  ? ?No suspicious osseous lesions. No soft tissue abn

## 2021-06-21 ENCOUNTER — Ambulatory Visit: Payer: Self-pay | Admitting: *Deleted

## 2021-06-21 NOTE — Telephone Encounter (Signed)
I returned pt's call.   C/o sciatic nerve pain down left leg for 1 week.  His PCP has never prescribed prednisone but it has helped this problem in the past.  Saw an orthopedic dr. Several yrs ago who prescribed prednisone and it helped.   He said it would be helpful if PCP would prescribe prednisone without an appt. ? ? ?Reason for Disposition ? [1] Pain radiates into the thigh or further down the leg AND [2] one leg ? ?Answer Assessment - Initial Assessment Questions ?1. ONSET: "When did the pain begin?"  ?    C/o sciatic pain that radiates down left leg.    Several years ago at work I hurt my back.   I was given prednisone for it from the ortho dr.  Which helped. ?2. LOCATION: "Where does it hurt?" (upper, mid or lower back) ?    Center of lower back.   I can move certain ways and it pops.   The pain is going down my leg.   Not hurting in back. ?3. SEVERITY: "How bad is the pain?"  (e.g., Scale 1-10; mild, moderate, or severe) ?  - MILD (1-3): doesn't interfere with normal activities  ?  - MODERATE (4-7): interferes with normal activities or awakens from sleep  ?  - SEVERE (8-10): excruciating pain, unable to do any normal activities  ?    *No Answer* ?4. PATTERN: "Is the pain constant?" (e.g., yes, no; constant, intermittent)  ?    *No Answer* ?5. RADIATION: "Does the pain shoot into your legs or elsewhere?" ?    Yes having numbing and tingling. ?6. CAUSE:  "What do you think is causing the back pain?"  ?    *No Answer* ?7. BACK OVERUSE:  "Any recent lifting of heavy objects, strenuous work or exercise?" ?    *No Answer* ?8. MEDICATIONS: "What have you taken so far for the pain?" (e.g., nothing, acetaminophen, NSAIDS) ?    *No Answer* ?9. NEUROLOGIC SYMPTOMS: "Do you have any weakness, numbness, or problems with bowel/bladder control?" ?    *No Answer* ?10. OTHER SYMPTOMS: "Do you have any other symptoms?" (e.g., fever, abdominal pain, burning with urination, blood in urine) ?      *No Answer* ?11. PREGNANCY:  "Is there any chance you are pregnant?" (e.g., yes, no; LMP) ?      *No Answer* ? ?Protocols used: Back Pain-A-AH ? ?

## 2021-06-21 NOTE — Telephone Encounter (Signed)
?  Chief Complaint: pain down left leg from sciatic nerve.  Injured back several yrs ago ?Symptoms: No back pain but pain and tingling going down left leg ?Frequency: daily ?Pertinent Negatives: Patient denies not being able to walk. ?Disposition: [] ED /[] Urgent Care (no appt availability in office) / [x] Appointment(In office/virtual)/ []  [facility]/ [] Home Care/ [] Refused Recommended Disposition /[] [facility]/ []  Follow-up with PCP ?Additional Notes:   ?

## 2021-06-22 ENCOUNTER — Ambulatory Visit (INDEPENDENT_AMBULATORY_CARE_PROVIDER_SITE_OTHER): Payer: Medicare HMO | Admitting: Internal Medicine

## 2021-06-22 ENCOUNTER — Encounter: Payer: Self-pay | Admitting: Internal Medicine

## 2021-06-22 VITALS — BP 128/74 | HR 81 | Ht 66.0 in | Wt 147.0 lb

## 2021-06-22 DIAGNOSIS — K8020 Calculus of gallbladder without cholecystitis without obstruction: Secondary | ICD-10-CM | POA: Diagnosis not present

## 2021-06-22 DIAGNOSIS — M5442 Lumbago with sciatica, left side: Secondary | ICD-10-CM | POA: Diagnosis not present

## 2021-06-22 DIAGNOSIS — I714 Abdominal aortic aneurysm, without rupture, unspecified: Secondary | ICD-10-CM

## 2021-06-22 MED ORDER — PREDNISONE 10 MG PO TABS: ORAL_TABLET | ORAL | 0 refills | Status: AC

## 2021-06-22 NOTE — Progress Notes (Signed)
? ? ?Date:  06/22/2021  ? ?Name:  Trevor Werner   DOB:  Nov 12, 1950   MRN:  [id number] ? ? ?Chief Complaint: Leg Pain and Referral (Dr. Concha Pyo - Cardiovascular [facility] for Aorta issue) ? ?Leg Pain  ?The incident occurred 5 to 7 days ago (Over a year ago). There was no injury mechanism. The pain is present in the left leg, left thigh and left hip. The pain is at a severity of 7/10. The pain is moderate. The pain has been Worsening since onset. Associated symptoms include an inability to bear weight, numbness and tingling. The symptoms are aggravated by movement, weight bearing and palpation.  ?Back Pain ?This is a recurrent problem. The pain is present in the lumbar spine. The quality of the pain is described as shooting. The pain radiates to the left knee. The pain is moderate. Associated symptoms include leg pain, numbness and tingling. Pertinent negatives include no chest pain, fever or weakness. He has tried heat and NSAIDs for the symptoms. The treatment provided mild relief.  ?Gall bladder stones - seen by General Surgery.  They will not do any procedure unless he is cleared by vascular surgery because his enlarging AAA.  He would like to be referred back to [facility].  Since starting omeprazole the abdominal symptoms are improving. ? ?Lab Results  ?Component Value Date  ? NA 137 05/31/2021  ? K 4.4 05/31/2021  ? CO2 25 05/31/2021  ? GLUCOSE 135 (H) 05/31/2021  ? BUN 12 05/31/2021  ? CREATININE 1.16 05/31/2021  ? CALCIUM 10.1 05/31/2021  ? EGFR 67 05/31/2021  ? GFRNONAA 65 05/26/2019  ? ?Lab Results  ?Component Value Date  ? CHOL 167 05/31/2021  ? HDL 57 05/31/2021  ? [place] 92 05/31/2021  ? LDLDIRECT 164.8 12/09/2009  ? TRIG 97 05/31/2021  ? CHOLHDL 2.9 05/31/2021  ? ?Lab Results  ?Component Value Date  ? TSH 2.580 05/17/2015  ? ?Lab Results  ?Component Value Date  ? HGBA1C 5.4 05/31/2021  ? ?Lab Results  ?Component Value Date  ? WBC 6.9 05/31/2021  ? HGB 16.6 05/31/2021  ? HCT 48.1 05/31/2021  ? MCV 96 05/31/2021  ?  PLT 187 05/31/2021  ? ?Lab Results  ?Component Value Date  ? ALT 18 05/31/2021  ? AST 25 05/31/2021  ? ALKPHOS 87 05/31/2021  ? BILITOT 1.9 (H) 05/31/2021  ? ?No results found for: 25OHVITD2, [place], VD25OH  ? ?Review of Systems  ?Constitutional:  Positive for appetite change (due to back pain). Negative for chills, fatigue and fever.  ?Respiratory:  Negative for cough, chest tightness and wheezing.   ?Cardiovascular:  Negative for chest pain and palpitations.  ?Musculoskeletal:  Positive for back pain and gait problem.  ?Neurological:  Positive for tingling and numbness. Negative for tremors and weakness.  ?Psychiatric/Behavioral:  Positive for sleep disturbance. Negative for dysphoric mood. The patient is not nervous/anxious.   ? ?Patient Active Problem List  ? Diagnosis Date Noted  ? Chronic obstructive pulmonary disease ([place]) 05/26/2019  ? Fasting hyperglycemia 06/14/2018  ? Rising PSA level 05/18/2016  ? CAD (coronary artery disease) 11/01/2015  ? Pulmonary nodule, right 05/17/2015  ? Edema leg 02/02/2015  ? AAA (abdominal aortic aneurysm) without rupture ([place]) 07/28/2014  ? Benign essential HTN 07/28/2014  ? Mixed hyperlipidemia 07/28/2014  ? Reflux esophagitis 03/28/2010  ? 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 ? ?No Known Allergies ? ?Past Surgical History:  ?Procedure Laterality Date  ? CATARACT EXTRACTION Bilateral   ? COLONOSCOPY  2011  ?  HAND SURGERY Right 1974  ? RHINOPLASTY    ? ? ?Social History  ? ?Tobacco Use  ? Smoking status: Former  ?  Packs/day: 2.00  ?  Years: 15.00  ?  Pack years: 30.00  ?  Types: Cigarettes  ?  Quit date: 03/20/1978  ?  Years since quitting: 43.2  ? Smokeless tobacco: Never  ? Tobacco comments:  ?  smoking cessation materials not required  ?Vaping Use  ? Vaping Use: Never used  ?Substance Use Topics  ? Alcohol use: Yes  ?  Alcohol/week: 22.0 standard drinks  ?  Types: 10 Shots of liquor, 12 Cans of beer per week  ? Drug use: No  ? ? ? ?Medication list has been reviewed and  updated. ? ?Current Meds  ?Medication Sig  ? albuterol (VENTOLIN HFA) 108 (90 Base) MCG/ACT inhaler Inhale 2 puffs into the lungs every 6 (six) hours as needed for wheezing or shortness of breath.  ? amLODipine (NORVASC) 5 MG tablet Take 1 tablet (5 mg total) by mouth daily.  ? aspirin EC 81 MG tablet Take 1 tablet by mouth daily.  ? atorvastatin (LIPITOR) 10 MG tablet Take 1 tablet (10 mg total) by mouth at bedtime.  ? carvedilol (COREG) 6.25 MG tablet Take 1 tablet (6.25 mg total) by mouth 2 (two) times daily.  ? hydrochlorothiazide (HYDRODIURIL) 25 MG tablet Take 1 tablet (25 mg total) by mouth daily.  ? losartan (COZAAR) 100 MG tablet Take 1 tablet (100 mg total) by mouth daily.  ? omeprazole (PRILOSEC) 20 MG capsule Take 1 capsule (20 mg total) by mouth 2 (two) times daily before a meal.  ? ? ? ?  06/22/2021  ?  2:16 PM 05/31/2021  ?  8:49 AM 05/26/2020  ? 10:15 AM  ?GAD 7 : Generalized Anxiety Score  ?Nervous, Anxious, on Edge 0 0 0  ?Control/stop worrying 0 0 0  ?Worry too much - different things 0 0 0  ?Trouble relaxing 0 0 0  ?Restless 0 0 0  ?Easily annoyed or irritable 0 0 0  ?Afraid - awful might happen 0 0 0  ?Total GAD 7 Score 0 0 0  ?Anxiety Difficulty Not difficult at all Not difficult at all   ? ? ? ?  06/22/2021  ?  2:16 PM  ?Depression screen PHQ 2/9  ?Decreased Interest 0  ?Down, Depressed, Hopeless 0  ?PHQ - 2 Score 0  ?Altered sleeping 0  ?Tired, decreased energy 0  ?Change in appetite 0  ?Feeling bad or failure about yourself  0  ?Trouble concentrating 0  ?Moving slowly or fidgety/restless 0  ?Suicidal thoughts 0  ?PHQ-9 Score 0  ?Difficult doing work/chores Not difficult at all  ? ? ?BP Readings from Last 3 Encounters:  ?06/22/21 128/74  ?06/06/21 (!) 141/94  ?05/31/21 118/80  ? ? ?Physical Exam ?Vitals and nursing note reviewed.  ?Constitutional:   ?   General: He is not in acute distress. ?   Appearance: Normal appearance. He is well-developed.  ?HENT:  ?   Head: Normocephalic and atraumatic.   ?Cardiovascular:  ?   Rate and Rhythm: Normal rate and regular rhythm.  ?Pulmonary:  ?   Effort: Pulmonary effort is normal. No respiratory distress.  ?   Breath sounds: No wheezing or rhonchi.  ?Musculoskeletal:  ?   Lumbar back: Bony tenderness present. Negative right straight leg raise test and negative left straight leg raise test.  ?   Right lower leg: No edema.  ?  Left lower leg: No edema.  ?Skin: ?   General: Skin is warm and dry.  ?   Findings: No rash.  ?Neurological:  ?   Mental Status: He is alert and oriented to person, place, and time.  ?   Sensory: Sensation is intact.  ?   Motor: Motor function is intact.  ?   Deep Tendon Reflexes:  ?   Reflex Scores: ?     Patellar reflexes are 2+ on the right side and 2+ on the left side. ?Psychiatric:     ?   Mood and Affect: Mood normal.     ?   Behavior: Behavior normal.  ? ? ?Wt Readings from Last 3 Encounters:  ?06/22/21 147 lb (66.7 kg)  ?06/06/21 150 lb 3.2 oz (68.1 kg)  ?05/31/21 158 lb (71.7 kg)  ? ? ?BP 128/74   Pulse 81   Ht _0  (1.676 m)   Wt 147 lb (66.7 kg)   SpO2 98%   BMI 23.73 kg/m?  ? ?Assessment and Plan: ?1. Acute bilateral low back pain with left-sided sciatica ?Continue heat, modified activities, add steroid taper. ?- predniSONE (DELTASONE) 10 MG tablet; Take 6 on day 1and 2, 5 on day 3 and 4, 4 on day 5 and 6 , 3 on day 7 and 8, 2 on day 9 and 10 and 1 on day 11 and 12 then stop.  Dispense: 42 tablet; Refill: 0 ? ?2. Abdominal aortic aneurysm (AAA) without rupture, unspecified part ([place]) ?- Ambulatory referral to Cardiovascular Surgery ? ?3. Gall bladder stones ?Improved symptom control ?Continue PPI ? ? ?Partially dictated using Editor, commissioning. Any errors are unintentional. ? ?Halina Maidens, MD ?[facility] ? ?06/22/2021 ? ? ? ? ?

## 2021-06-28 DIAGNOSIS — I10 Essential (primary) hypertension: Secondary | ICD-10-CM | POA: Diagnosis not present

## 2021-06-28 DIAGNOSIS — E785 Hyperlipidemia, unspecified: Secondary | ICD-10-CM | POA: Diagnosis not present

## 2021-06-28 DIAGNOSIS — R0789 Other chest pain: Secondary | ICD-10-CM | POA: Diagnosis not present

## 2021-06-28 DIAGNOSIS — R0603 Acute respiratory distress: Secondary | ICD-10-CM | POA: Diagnosis not present

## 2021-06-28 DIAGNOSIS — R079 Chest pain, unspecified: Secondary | ICD-10-CM | POA: Diagnosis not present

## 2021-06-28 DIAGNOSIS — I469 Cardiac arrest, cause unspecified: Secondary | ICD-10-CM | POA: Diagnosis not present

## 2021-06-28 DIAGNOSIS — Z87891 Personal history of nicotine dependence: Secondary | ICD-10-CM | POA: Diagnosis not present

## 2021-06-28 DIAGNOSIS — R0689 Other abnormalities of breathing: Secondary | ICD-10-CM | POA: Diagnosis not present

## 2021-06-28 DIAGNOSIS — R739 Hyperglycemia, unspecified: Secondary | ICD-10-CM | POA: Diagnosis not present

## 2021-06-28 DIAGNOSIS — R062 Wheezing: Secondary | ICD-10-CM | POA: Diagnosis not present

## 2021-06-28 DIAGNOSIS — I2510 Atherosclerotic heart disease of native coronary artery without angina pectoris: Secondary | ICD-10-CM | POA: Diagnosis not present

## 2021-06-28 DIAGNOSIS — I712 Thoracic aortic aneurysm, without rupture, unspecified: Secondary | ICD-10-CM | POA: Diagnosis not present

## 2021-06-28 DIAGNOSIS — R069 Unspecified abnormalities of breathing: Secondary | ICD-10-CM | POA: Diagnosis not present

## 2021-07-11 ENCOUNTER — Ambulatory Visit: Payer: Medicare HMO | Admitting: Surgery

## 2021-07-18 DEATH — deceased

## 2021-12-02 ENCOUNTER — Ambulatory Visit: Payer: Medicare HMO | Admitting: Internal Medicine

## 2021-12-02 NOTE — Progress Notes (Deleted)
Date:  12/02/2021   Name:  Trevor Werner   DOB:  [birth year]   MRN:  [id number]   Chief Complaint: No chief complaint on file.  Hypertension This is a chronic problem. The problem is controlled. Pertinent negatives include no chest pain, headaches, palpitations or shortness of breath. Past treatments include angiotensin blockers, diuretics, calcium channel blockers and beta blockers. Hypertensive end-organ damage includes CAD/MI. There is no history of kidney disease or CVA.  Benign Prostatic Hypertrophy This is a new (PSA has increased significantly last check) problem.   Lab Results  Component Value Date   PSA1 3.4 05/31/2021   PSA1 2.8 05/26/2020   PSA1 2.6 05/26/2019   PSA 3.1 07/02/2012   PSA 1.93 12/09/2009    Lab Results  Component Value Date   NA 137 05/31/2021   K 4.4 05/31/2021   CO2 25 05/31/2021   GLUCOSE 135 (H) 05/31/2021   BUN 12 05/31/2021   CREATININE 1.16 05/31/2021   CALCIUM 10.1 05/31/2021   EGFR 67 05/31/2021   GFRNONAA 65 05/26/2019   Lab Results  Component Value Date   CHOL 167 05/31/2021   HDL 57 05/31/2021   LDLCALC 92 05/31/2021   LDLDIRECT 164.8 12/09/2009   TRIG 97 05/31/2021   CHOLHDL 2.9 05/31/2021   Lab Results  Component Value Date   TSH 2.580 05/17/2015   Lab Results  Component Value Date   HGBA1C 5.4 05/31/2021   Lab Results  Component Value Date   WBC 6.9 05/31/2021   HGB 16.6 05/31/2021   HCT 48.1 05/31/2021   MCV 96 05/31/2021   PLT 187 05/31/2021   Lab Results  Component Value Date   ALT 18 05/31/2021   AST 25 05/31/2021   ALKPHOS 87 05/31/2021   BILITOT 1.9 (H) 05/31/2021   No results found for: "25OHVITD2", "25OHVITD3", "VD25OH"   Review of Systems  Constitutional:  Negative for fatigue and unexpected weight change.  HENT:  Negative for nosebleeds.   Eyes:  Negative for visual disturbance.  Respiratory:  Negative for cough, chest tightness, shortness of breath and wheezing.   Cardiovascular:  Negative  for chest pain, palpitations and leg swelling.  Gastrointestinal:  Negative for abdominal pain, constipation and diarrhea.  Neurological:  Negative for dizziness, weakness, light-headedness and headaches.    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    No Known Allergies  Past Surgical History:  Procedure Laterality Date   CATARACT EXTRACTION Bilateral    COLONOSCOPY  2011   HAND SURGERY Right 1974   RHINOPLASTY      Social History   Tobacco Use   Smoking status: Former    Packs/day: 2.00    Years: 15.00    Total pack years: 30.00    Types: Cigarettes    Quit date: 03/20/1978    Years since quitting: 43.7   Smokeless tobacco: Never   Tobacco comments:    smoking cessation materials not required  Vaping Use   Vaping Use: Never used  Substance Use Topics   Alcohol use: Yes    Alcohol/week: 22.0 standard drinks of  alcohol    Types: 10 Shots of liquor, 12 Cans of beer per week   Drug use: No     Medication list has been reviewed and updated.  No outpatient medications have been marked as taking for the 12/02/21 encounter (Appointment) with Glean Hess, MD.       06/22/2021    2:16 PM 05/31/2021    8:49 AM 05/26/2020   10:15 AM  GAD 7 : Generalized Anxiety Score  Nervous, Anxious, on Edge 0 0 0  Control/stop worrying 0 0 0  Worry too much - different things 0 0 0  Trouble relaxing 0 0 0  Restless 0 0 0  Easily annoyed or irritable 0 0 0  Afraid - awful might happen 0 0 0  Total GAD 7 Score 0 0 0  Anxiety Difficulty Not  difficult at all Not difficult at all        06/22/2021    2:16 PM 05/31/2021    8:49 AM 05/26/2020   10:14 AM  Depression screen PHQ 2/9  Decreased Interest 0 0 0  Down, Depressed, Hopeless 0 0 0  PHQ - 2 Score 0 0 0  Altered sleeping 0 0 0  Tired, decreased energy 0 0 0  Change in appetite 0 0 0  Feeling bad or failure about yourself  0 0 0  Trouble concentrating 0 0 0  Moving slowly or fidgety/restless 0 0 0  Suicidal thoughts 0 0 0  PHQ-9 Score 0 0 0  Difficult doing work/chores Not difficult at all Not difficult at all     BP Readings from Last 3 Encounters:  06/22/21 128/74  06/06/21 (!) 141/94  05/31/21 118/80    Physical Exam Vitals and nursing note reviewed.  Constitutional:      General: He is not in acute distress.    Appearance: He is well-developed.  HENT:     Head: Normocephalic and atraumatic.  Pulmonary:     Effort: Pulmonary effort is normal. No respiratory distress.  Skin:    General: Skin is warm and dry.     Findings: No rash.  Neurological:     Mental Status: He is alert and oriented to person, place, and time.  Psychiatric:        Mood and Affect: Mood normal.        Behavior: Behavior normal.     Wt Readings from Last 3 Encounters:  06/22/21 147 lb (66.7 kg)  06/06/21 150 lb 3.2 oz (68.1 kg)  05/31/21 158 lb (71.7 kg)    There were no vitals taken for this visit.  Assessment and Plan:

## 2021-12-05 ENCOUNTER — Encounter: Payer: Self-pay | Admitting: Internal Medicine

## 2022-06-05 ENCOUNTER — Encounter: Payer: Medicare HMO | Admitting: Internal Medicine

## 2022-06-05 NOTE — Assessment & Plan Note (Deleted)
Clinically stable exam with well controlled BP on amlodipine, coreg, hctz and losartan. Tolerating medications without side effects. Pt to continue current regimen and low sodium diet.

## 2022-06-05 NOTE — Assessment & Plan Note (Deleted)
Tolerating statin medications without concerns LDL is  Lab Results  Component Value Date   LDLCALC 92 05/31/2021   with a goal of < 70. Current dose will be adjusted if needed.
